# Patient Record
Sex: Female | Born: 1965 | Race: Black or African American | Hispanic: No | Marital: Single | Smoking: Never smoker
Health system: Southern US, Community
[De-identification: ages and names within clinical notes are randomized; demographics above are authoritative.]

## PROBLEM LIST (undated history)

## (undated) DIAGNOSIS — T7840XA Allergy, unspecified, initial encounter: Secondary | ICD-10-CM

## (undated) DIAGNOSIS — Z973 Presence of spectacles and contact lenses: Secondary | ICD-10-CM

## (undated) DIAGNOSIS — Z9289 Personal history of other medical treatment: Secondary | ICD-10-CM

## (undated) DIAGNOSIS — G4700 Insomnia, unspecified: Secondary | ICD-10-CM

## (undated) HISTORY — DX: Personal history of other medical treatment: Z92.89

## (undated) HISTORY — DX: Insomnia, unspecified: G47.00

## (undated) HISTORY — DX: Presence of spectacles and contact lenses: Z97.3

## (undated) HISTORY — DX: Allergy, unspecified, initial encounter: T78.40XA

---
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
## 2000-01-30 ENCOUNTER — Other Ambulatory Visit: Admission: RE | Admit: 2000-01-30 | Discharge: 2000-01-30 | Payer: Self-pay | Admitting: Family Medicine

## 2006-02-19 ENCOUNTER — Ambulatory Visit (HOSPITAL_COMMUNITY): Admission: RE | Admit: 2006-02-19 | Discharge: 2006-02-19 | Payer: Self-pay | Admitting: Obstetrics and Gynecology

## 2006-08-31 HISTORY — PX: TUBAL LIGATION: SHX77

## 2007-12-07 ENCOUNTER — Ambulatory Visit: Payer: Self-pay | Admitting: Family Medicine

## 2008-01-02 ENCOUNTER — Ambulatory Visit: Payer: Self-pay | Admitting: Family Medicine

## 2008-01-03 LAB — HM MAMMOGRAPHY

## 2008-11-21 ENCOUNTER — Ambulatory Visit: Payer: Self-pay | Admitting: Family Medicine

## 2008-12-05 ENCOUNTER — Ambulatory Visit: Payer: Self-pay | Admitting: Family Medicine

## 2010-02-06 ENCOUNTER — Ambulatory Visit: Payer: Self-pay | Admitting: Family Medicine

## 2010-02-13 ENCOUNTER — Ambulatory Visit: Payer: Self-pay | Admitting: Family Medicine

## 2010-07-14 ENCOUNTER — Ambulatory Visit: Payer: Self-pay | Admitting: Family Medicine

## 2010-08-15 ENCOUNTER — Ambulatory Visit: Payer: Self-pay | Admitting: Family Medicine

## 2011-02-20 ENCOUNTER — Encounter: Payer: Self-pay | Admitting: Family Medicine

## 2011-09-01 DIAGNOSIS — Z9289 Personal history of other medical treatment: Secondary | ICD-10-CM

## 2011-09-01 HISTORY — DX: Personal history of other medical treatment: Z92.89

## 2011-09-11 ENCOUNTER — Encounter: Payer: Self-pay | Admitting: Family Medicine

## 2011-09-18 ENCOUNTER — Ambulatory Visit (INDEPENDENT_AMBULATORY_CARE_PROVIDER_SITE_OTHER): Payer: BC Managed Care – PPO | Admitting: Family Medicine

## 2011-09-18 ENCOUNTER — Encounter: Payer: Self-pay | Admitting: Family Medicine

## 2011-09-18 VITALS — BP 118/70 | HR 87 | Ht 65.5 in | Wt 196.0 lb

## 2011-09-18 DIAGNOSIS — Z0000 Encounter for general adult medical examination without abnormal findings: Secondary | ICD-10-CM

## 2011-09-18 DIAGNOSIS — E785 Hyperlipidemia, unspecified: Secondary | ICD-10-CM

## 2011-09-18 NOTE — Progress Notes (Signed)
  Subjective:    Patient ID: Stacie Oconnell, female    DOB: 1966/07/02, 46 y.o.   MRN: [id number]  HPI She is here for a complete examination. Review of her record indicates she has had mammogram as well as Pap smear done in appropriate time intervals. She is not on any medications. She does not smoke. Her work is going well. She started an exercise program that includes swimming 3 or 4 times per week. He continues to work 2 jobs. Her personal life seems to be doing well..   Review of Systems  Constitutional: Negative.   HENT: Negative.   Eyes: Negative.   Respiratory: Negative.   Cardiovascular: Negative.   Gastrointestinal: Negative.   Genitourinary: Negative.   Musculoskeletal: Negative.   Skin: Negative.   Hematological: Negative.   Psychiatric/Behavioral: Negative.        Objective:   Physical Exam BP 118/70  Pulse 87  Ht 5' 5.5" (1.664 m)  Wt 196 lb (88.905 kg)  BMI 32.12 kg/m2  LMP 08/31/2011  General Appearance:    Alert, cooperative, no distress, appears stated age  Head:    Normocephalic, without obvious abnormality, atraumatic  Eyes:    PERRL, conjunctiva/corneas clear, EOM's intact, fundi    benign  Ears:    Normal TM's and external ear canals  Nose:   Nares normal, mucosa normal, no drainage or sinus   tenderness  Throat:   Lips, mucosa, and tongue normal; teeth and gums normal  Neck:   Supple, no lymphadenopathy;  thyroid:  no   enlargement/tenderness/nodules; no carotid   bruit or JVD  Back:    Spine nontender, no curvature, ROM normal, no CVA     tenderness  Lungs:     Clear to auscultation bilaterally without wheezes, rales or     ronchi; respirations unlabored  Chest Wall:    No tenderness or deformity   Heart:    Regular rate and rhythm, S1 and S2 normal, no murmur, rub   or gallop  Breast Exam:    Deferred to GYN  Abdomen:     Soft, non-tender, nondistended, normoactive bowel sounds,    no masses, no hepatosplenomegaly  Genitalia:    Deferred to GYN      Extremities:   No clubbing, cyanosis or edema  Pulses:   2+ and symmetric all extremities  Skin:   Skin color, texture, turgor normal, no rashes or lesions  Lymph nodes:   Cervical, supraclavicular, and axillary nodes normal  Neurologic:   CNII-XII intact, normal strength, sensation and gait; reflexes 2+ and symmetric throughout          Psych:   Normal mood, affect, hygiene and grooming.           Assessment & Plan:   1. Hyperlipidemia LDL goal < 100  Lipid panel  2. Routine general medical examination at a health care facility    Encouraged her to continue to diet and exercise regimen discussed other ways to increase her physical activity. Also discussed cutting back on carbohydrates and eating more frequent but smaller meals.

## 2011-09-18 NOTE — Patient Instructions (Signed)
Increase your physical activity especially during your breaks at work. Make dietary changes especially in regards to carbohydrates.

## 2011-09-19 LAB — LIPID PANEL
Cholesterol: 231 mg/dL — ABNORMAL HIGH (ref 0–200)
HDL: 59 mg/dL (ref >39)
Triglycerides: 106 mg/dL (ref <150)
VLDL: 21 mg/dL (ref 0–40)

## 2011-09-28 ENCOUNTER — Telehealth: Payer: Self-pay | Admitting: Internal Medicine

## 2011-09-28 NOTE — Telephone Encounter (Signed)
Did you see this? 

## 2011-09-28 NOTE — Telephone Encounter (Signed)
Pt can't not use eucerin cream or eveno. Pt can use dove soap and cause you a white lotion she can use at the dollar store.

## 2011-09-28 NOTE — Telephone Encounter (Signed)
Will set pt up for dermatolgist

## 2011-09-28 NOTE — Telephone Encounter (Signed)
Martie Lee will schedule with dermatology

## 2011-09-28 NOTE — Telephone Encounter (Signed)
She shouldn't be itching from swimming. It could be too much chlorine in water or maybe she's trying her skin out with too much soap and water. Eucerin cream or Eveno

## 2011-09-29 NOTE — Telephone Encounter (Signed)
Got her scheduled with Dr. Harriette Ohara February 1st,2013 @3 :30pm at [facility]

## 2012-04-12 ENCOUNTER — Telehealth: Payer: Self-pay | Admitting: Internal Medicine

## 2012-04-12 NOTE — Telephone Encounter (Signed)
Pt will come in the morning she has already tried all of what JCL suggested but she has plato and has 15 lbs more to lose

## 2012-04-12 NOTE — Telephone Encounter (Signed)
She can come in and talk to me about this. She can also be referred to the dietitian. Also let her know that I think the Weight Watchers program is a good one

## 2012-04-13 ENCOUNTER — Telehealth: Payer: Self-pay | Admitting: Internal Medicine

## 2012-04-13 ENCOUNTER — Ambulatory Visit (INDEPENDENT_AMBULATORY_CARE_PROVIDER_SITE_OTHER): Payer: BC Managed Care – PPO | Admitting: Family Medicine

## 2012-04-13 ENCOUNTER — Encounter: Payer: Self-pay | Admitting: Family Medicine

## 2012-04-13 VITALS — BP 120/80 | HR 90 | Ht 66.0 in | Wt 199.0 lb

## 2012-04-13 DIAGNOSIS — E669 Obesity, unspecified: Secondary | ICD-10-CM

## 2012-04-13 DIAGNOSIS — E785 Hyperlipidemia, unspecified: Secondary | ICD-10-CM | POA: Insufficient documentation

## 2012-04-13 LAB — LIPID PANEL
HDL: 65 mg/dL (ref >39)
LDL Cholesterol: 146 mg/dL — ABNORMAL HIGH (ref 0–99)
Total CHOL/HDL Ratio: 3.5 Ratio

## 2012-04-13 MED ORDER — SIBUTRAMINE HCL MONOHYDRATE 5 MG PO CAPS: 5.0000 mg | ORAL_CAPSULE | Freq: Every day | ORAL | Status: DC

## 2012-04-13 MED ORDER — PHENTERMINE HCL 15 MG PO CAPS: 15.0000 mg | ORAL_CAPSULE | ORAL | Status: DC

## 2012-04-13 NOTE — Progress Notes (Signed)
  Subjective:    Patient ID: Stacie Oconnell, female    DOB: [birth year], 46 y.o.   MRN: [id number]  HPI She is here for consultation. She has made some dietary changes to help with her lipids. She did have some lipids drawn in January which were elevated. She is also concerned about weight loss. Her lifestyle is quite busy with work and home life. She recently got her Masters degree. She is interested in trying medication.   Review of Systems     Objective:   Physical Exam Alert and in no distress otherwise not examined       Assessment & Plan:   1. Obesity (BMI 30.0-34.9)  Amb ref to Medical Nutrition Therapy-MNT, DISCONTINUED: sibutramine (MERIDIA) 5 MG capsule  2. Hyperlipidemia LDL goal < 100  Lipid panel, phentermine 15 MG capsule   prescription written for she and her main. Discussed the fact that I would only do this for one month. Strongly encouraged her to continue physical activities and not let anything get in the way. Explained to her that it is more of a lifestyle problem into a weight problem. She does tend understand this.

## 2012-04-13 NOTE — Patient Instructions (Signed)
Use the Meridia for one month to help you get started. Remember, I will not continue on this medication. Return here as needed.

## 2012-04-13 NOTE — Telephone Encounter (Signed)
error 

## 2012-06-17 ENCOUNTER — Ambulatory Visit: Payer: BC Managed Care – PPO | Admitting: *Deleted

## 2012-06-29 ENCOUNTER — Telehealth: Payer: Self-pay | Admitting: Family Medicine

## 2012-06-29 MED ORDER — EPINEPHRINE 0.3 MG/0.3ML IJ DEVI: 0.3000 mg | Freq: Once | INTRAMUSCULAR | Status: DC

## 2012-06-29 NOTE — Telephone Encounter (Signed)
Epipen called in 

## 2012-07-19 ENCOUNTER — Telehealth: Payer: Self-pay | Admitting: Family Medicine

## 2012-07-19 MED ORDER — EPINEPHRINE 0.3 MG/0.3ML IJ DEVI: 0.3000 mg | Freq: Once | INTRAMUSCULAR | Status: DC

## 2012-07-19 NOTE — Telephone Encounter (Signed)
LM

## 2012-10-26 ENCOUNTER — Ambulatory Visit (INDEPENDENT_AMBULATORY_CARE_PROVIDER_SITE_OTHER): Payer: BC Managed Care – PPO | Admitting: Family Medicine

## 2012-10-26 ENCOUNTER — Encounter: Payer: Self-pay | Admitting: Family Medicine

## 2012-10-26 VITALS — BP 120/88 | HR 78 | Temp 98.6°F | Wt 198.0 lb

## 2012-10-26 DIAGNOSIS — R05 Cough: Secondary | ICD-10-CM

## 2012-10-26 MED ORDER — ZOLPIDEM TARTRATE 10 MG PO TABS: 10.0000 mg | ORAL_TABLET | Freq: Every evening | ORAL | Status: DC | PRN

## 2012-10-26 MED ORDER — AMOXICILLIN 875 MG PO TABS: 875.0000 mg | ORAL_TABLET | Freq: Two times a day (BID) | ORAL | Status: DC

## 2012-10-26 NOTE — Patient Instructions (Signed)
Take 2 Prilosec daily for the next several days and if that does not work forcoughing get the prescription filled

## 2012-10-26 NOTE — Progress Notes (Signed)
  Subjective:    Patient ID: Bascom Levels, female    DOB: May 03, 1966, 47 y.o.   MRN: [id number]  HPI In early January she developed URI symptoms of nasal congestion, runny nose, dry cough, fever and malaise. She states she got better for least a week and then developed sneezing as well as dry cough.  She is now back to normal except for the dry cough especially at night.she does not smoke and has no allergies.she has not tried any H2 blocker or other medication.  She also is getting counseling helping deal with work related stress. She is working with Clarisse Gouge concerning this. It is interfering with her sleep and she would like medication for this.   Review of Systems     Objective:   Physical Exam alert and in no distress. Tympanic membranes and canals are normal. Throat is clear. Tonsils are normal. Neck is supple without adenopathy or thyromegaly. Cardiac exam shows a regular sinus rhythm without murmurs or gallops. Lungs are clear to auscultation.        Assessment & Plan:  Sleep disturbance - Plan: zolpidem (AMBIEN) 10 MG tablet  Stress at work  Cough - Plan: amoxicillin (AMOXIL) 875 MG tablet I will have her try a PPI for several days first and if no improvement, then she will get the Amoxil filled. Also discussed stress and stress management with her. I will have her use Ambien for a short period time and then keep the rest around 4 as needed purposes. She is getting a lot of help dealing with stress from work and seems to have fairly good handle on this.

## 2012-11-09 ENCOUNTER — Encounter: Payer: Self-pay | Admitting: Internal Medicine

## 2012-11-15 ENCOUNTER — Encounter: Payer: BC Managed Care – PPO | Admitting: Medical

## 2012-11-25 ENCOUNTER — Telehealth: Payer: Self-pay | Admitting: Family Medicine

## 2012-11-25 ENCOUNTER — Encounter: Payer: Self-pay | Admitting: Medical

## 2012-11-25 ENCOUNTER — Ambulatory Visit (INDEPENDENT_AMBULATORY_CARE_PROVIDER_SITE_OTHER): Payer: Federal, State, Local not specified - PPO | Admitting: Medical

## 2012-11-25 VITALS — BP 120/80 | HR 68 | Temp 98.3°F | Resp 16 | Ht 66.0 in | Wt 200.0 lb

## 2012-11-25 DIAGNOSIS — G4700 Insomnia, unspecified: Secondary | ICD-10-CM

## 2012-11-25 DIAGNOSIS — Z113 Encounter for screening for infections with a predominantly sexual mode of transmission: Secondary | ICD-10-CM

## 2012-11-25 DIAGNOSIS — Z23 Encounter for immunization: Secondary | ICD-10-CM

## 2012-11-25 DIAGNOSIS — F430 Acute stress reaction: Secondary | ICD-10-CM

## 2012-11-25 DIAGNOSIS — R079 Chest pain, unspecified: Secondary | ICD-10-CM

## 2012-11-25 DIAGNOSIS — Z0000 Encounter for general adult medical examination without abnormal findings: Secondary | ICD-10-CM

## 2012-11-25 LAB — POCT URINALYSIS DIPSTICK
Blood, UA: NEGATIVE
Glucose, UA: NEGATIVE
Nitrite, UA: NEGATIVE
Urobilinogen, UA: NEGATIVE
pH, UA: 5

## 2012-11-25 MED ORDER — TRAZODONE HCL 50 MG PO TABS: 25.0000 mg | ORAL_TABLET | Freq: Every evening | ORAL | Status: DC | PRN

## 2012-11-25 NOTE — Telephone Encounter (Signed)
Patient is aware of her mammogram on 12/06/12 @ 1000 am at [facility]. CLS
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
## 2012-11-26 ENCOUNTER — Encounter: Payer: Self-pay | Admitting: Medical

## 2012-11-26 LAB — RPR

## 2013-09-08 ENCOUNTER — Other Ambulatory Visit (INDEPENDENT_AMBULATORY_CARE_PROVIDER_SITE_OTHER): Payer: Federal, State, Local not specified - PPO

## 2013-09-08 DIAGNOSIS — Z23 Encounter for immunization: Secondary | ICD-10-CM

## 2013-10-27 ENCOUNTER — Telehealth: Payer: Self-pay | Admitting: Family Medicine

## 2013-10-27 MED ORDER — EPINEPHRINE 0.3 MG/0.3ML IJ SOAJ: 0.3000 mg | Freq: Once | INTRAMUSCULAR | Status: DC

## 2013-10-27 NOTE — Telephone Encounter (Signed)
Epi pen sent in. ?

## 2013-10-27 NOTE — Telephone Encounter (Signed)
Go ahead and reorder

## 2014-02-20 ENCOUNTER — Encounter: Payer: Self-pay | Admitting: Family Medicine

## 2014-02-20 ENCOUNTER — Ambulatory Visit (INDEPENDENT_AMBULATORY_CARE_PROVIDER_SITE_OTHER): Payer: Federal, State, Local not specified - PPO | Admitting: Family Medicine

## 2014-02-20 VITALS — BP 110/80 | HR 80 | Ht 65.0 in | Wt 192.0 lb

## 2014-02-20 DIAGNOSIS — Z209 Contact with and (suspected) exposure to unspecified communicable disease: Secondary | ICD-10-CM

## 2014-02-20 DIAGNOSIS — E669 Obesity, unspecified: Secondary | ICD-10-CM

## 2014-02-20 DIAGNOSIS — E785 Hyperlipidemia, unspecified: Secondary | ICD-10-CM

## 2014-02-20 DIAGNOSIS — E66811 Obesity, class 1: Secondary | ICD-10-CM

## 2014-02-20 DIAGNOSIS — Z0000 Encounter for general adult medical examination without abnormal findings: Secondary | ICD-10-CM

## 2014-02-20 LAB — POCT URINALYSIS DIPSTICK
Bilirubin, UA: NEGATIVE
GLUCOSE UA: NEGATIVE
Ketones, UA: NEGATIVE
Leukocytes, UA: NEGATIVE
NITRITE UA: NEGATIVE
PROTEIN UA: NEGATIVE
SPEC GRAV UA: 1.02
UROBILINOGEN UA: NEGATIVE
pH, UA: 5

## 2014-02-20 LAB — CBC WITH DIFFERENTIAL/PLATELET
BASOS PCT: 0 % (ref 0–1)
Basophils Absolute: 0 10*3/uL (ref 0.0–0.1)
EOS PCT: 1 % (ref 0–5)
Eosinophils Absolute: 0.1 10*3/uL (ref 0.0–0.7)
HEMATOCRIT: 40.6 % (ref 36.0–46.0)
HEMOGLOBIN: 14.2 g/dL (ref 12.0–15.0)
Lymphocytes Relative: 34 % (ref 12–46)
Lymphs Abs: 2.5 10*3/uL (ref 0.7–4.0)
MCH: 31.7 pg (ref 26.0–34.0)
MCHC: 35 g/dL (ref 30.0–36.0)
MCV: 90.6 fL (ref 78.0–100.0)
MONO ABS: 0.5 10*3/uL (ref 0.1–1.0)
MONOS PCT: 7 % (ref 3–12)
NEUTROS PCT: 58 % (ref 43–77)
Neutro Abs: 4.2 10*3/uL (ref 1.7–7.7)
Platelets: 284 10*3/uL (ref 150–400)
RBC: 4.48 MIL/uL (ref 3.87–5.11)
RDW: 13.3 % (ref 11.5–15.5)
WBC: 7.3 10*3/uL (ref 4.0–10.5)

## 2014-02-20 NOTE — Progress Notes (Signed)
   Subjective:    Patient ID: Stacie Oconnell, female    DOB: Jan 16, 1966, 48 y.o.   MRN: [id number]  HPI She is here for a general med check. She has no particular concerns. She is in the process of trying to find a new job. Apparently there are some work-related stress is going on. She has started an exercise program. Review his record indicates elevated lipid panel. Social and family history were reviewed. She has no other concerns or complaints. Her last gynecologic evaluation was 2 years ago. He would like to be STD tested just to be safe.   Review of Systems  All other systems reviewed and are negative.      Objective:   Physical Exam BP 110/80  Pulse 80  Ht 5\' 5"  (1.651 m)  Wt 192 lb (87.091 kg)  BMI 31.95 kg/m2  General Appearance:    Alert, cooperative, no distress, appears stated age  Head:    Normocephalic, without obvious abnormality, atraumatic  Eyes:    PERRL, conjunctiva/corneas clear, EOM's intact, fundi    benign  Ears:    Normal TM's and external ear canals  Nose:   Nares normalI  mucosa normal, no drainage or sinus   tenderness  Throat:   Lips, mucosa, and tongue normal; teeth and gums normal  Neck:   Supple, no lymphadenopathy;  thyroid:  no   enlargement/tenderness/nodules; no carotid   bruit or JVD  Back:    Spine nontender, no curvature, ROM normal, no CVA     tenderness  Lungs:     Clear to auscultation bilaterally without wheezes, rales or     ronchi; respirations unlabored  Chest Wall:    No tenderness or deformity   Heart:    Regular rate and rhythm, S1 and S2 normal, no murmur, rub   or gallop  Breast Exam:    Deferred to GYN  Abdomen:     Soft, non-tender, nondistended, normoactive bowel sounds,    no masses, no hepatosplenomegaly  Genitalia:    Deferred to GYN     Extremities:   No clubbing, cyanosis or edema  Pulses:   2+ and symmetric all extremities  Skin:   Skin color, texture, turgor normal, no rashes or lesions  Lymph nodes:   Cervical,  supraclavicular, and axillary nodes normal  Neurologic:   CNII-XII intact, normal strength, sensation and gait; reflexes 2+ and symmetric throughout          Psych:   Normal mood, affect, hygiene and grooming.          Assessment & Plan:  Routine general medical examination at a health care facility - Plan: Urinalysis Dipstick, CBC with Differential, Comprehensive metabolic panel, Lipid panel  Obesity (BMI 30.0-34.9)  Hyperlipidemia with target LDL less than 100 - Plan: Lipid panel  Contact with or exposure to unspecified communicable disease - Plan: HIV antibody, RPR discussed diet and exercise with her in regard to total time of exercising and cutting back on carbohydrates.,

## 2014-02-20 NOTE — Patient Instructions (Addendum)
Cut back on white food. 150 minutes a week of something physical 

## 2014-02-21 LAB — COMPREHENSIVE METABOLIC PANEL
ALK PHOS: 51 U/L (ref 39–117)
ALT: 13 U/L (ref 0–35)
AST: 13 U/L (ref 0–37)
Albumin: 4.8 g/dL (ref 3.5–5.2)
BILIRUBIN TOTAL: 1 mg/dL (ref 0.2–1.2)
BUN: 9 mg/dL (ref 6–23)
CO2: 26 mEq/L (ref 19–32)
CREATININE: 0.76 mg/dL (ref 0.50–1.10)
Calcium: 9.7 mg/dL (ref 8.4–10.5)
Chloride: 100 mEq/L (ref 96–112)
GLUCOSE: 91 mg/dL (ref 70–99)
Potassium: 4.2 mEq/L (ref 3.5–5.3)
Sodium: 136 mEq/L (ref 135–145)
Total Protein: 7.8 g/dL (ref 6.0–8.3)

## 2014-02-21 LAB — HIV ANTIBODY (ROUTINE TESTING W REFLEX): HIV 1&2 Ab, 4th Generation: NONREACTIVE

## 2014-02-21 LAB — LIPID PANEL
CHOLESTEROL: 220 mg/dL — AB (ref 0–200)
HDL: 66 mg/dL (ref >39)
LDL Cholesterol: 136 mg/dL — ABNORMAL HIGH (ref 0–99)
TRIGLYCERIDES: 91 mg/dL (ref <150)
Total CHOL/HDL Ratio: 3.3 Ratio
VLDL: 18 mg/dL (ref 0–40)

## 2014-02-21 LAB — RPR

## 2014-02-27 ENCOUNTER — Encounter: Payer: Self-pay | Admitting: Family Medicine

## 2014-02-27 ENCOUNTER — Telehealth: Payer: Self-pay

## 2014-02-27 NOTE — Telephone Encounter (Signed)
Letter typed for pt, left message ready for pick up

## 2014-02-27 NOTE — Telephone Encounter (Signed)
Take care of this 

## 2014-02-27 NOTE — Telephone Encounter (Signed)
Pt states she is getting ready to go out of the country and she needs a note to take on the plane with her saying that she has skin rash problems and that she can only use certain lotions as well as she has to carry her epi pen with her at all times.

## 2014-03-07 ENCOUNTER — Telehealth: Payer: Self-pay | Admitting: Family Medicine

## 2014-03-07 NOTE — Telephone Encounter (Signed)
Needs OV.  

## 2014-03-07 NOTE — Telephone Encounter (Signed)
She needs to schedule an appt

## 2014-03-08 NOTE — Telephone Encounter (Signed)
Pt advised that she needs appt. She is going out of town today so she will call when she gets back in town to schedule an appt

## 2014-03-28 ENCOUNTER — Ambulatory Visit (INDEPENDENT_AMBULATORY_CARE_PROVIDER_SITE_OTHER): Payer: Federal, State, Local not specified - PPO | Admitting: Family Medicine

## 2014-03-28 ENCOUNTER — Encounter: Payer: Self-pay | Admitting: Family Medicine

## 2014-03-28 VITALS — BP 150/100 | HR 80 | Wt 194.0 lb

## 2014-03-28 DIAGNOSIS — G4700 Insomnia, unspecified: Secondary | ICD-10-CM

## 2014-03-28 DIAGNOSIS — Z569 Unspecified problems related to employment: Secondary | ICD-10-CM

## 2014-03-28 DIAGNOSIS — Z566 Other physical and mental strain related to work: Secondary | ICD-10-CM

## 2014-03-28 MED ORDER — TRAZODONE HCL 50 MG PO TABS: 25.0000 mg | ORAL_TABLET | Freq: Every evening | ORAL | Status: DC | PRN

## 2014-03-28 NOTE — Patient Instructions (Addendum)
Insomnia Insomnia is frequent trouble falling and/or staying asleep. Insomnia can be a long term problem or a short term problem. Both are common. Insomnia can be a short term problem when the wakefulness is related to a certain stress or worry. Long term insomnia is often related to ongoing stress during waking hours and/or poor sleeping habits. Overtime, sleep deprivation itself can make the problem worse. Every little thing feels more severe because you are overtired and your ability to cope is decreased. CAUSES   Stress, anxiety, and depression.  Poor sleeping habits.  Distractions such as TV in the bedroom.  Naps close to bedtime.  Engaging in emotionally charged conversations before bed.  Technical reading before sleep.  Alcohol and other sedatives. They may make the problem worse. They can hurt normal sleep patterns and normal dream activity.  Stimulants such as caffeine for several hours prior to bedtime.  Pain syndromes and shortness of breath can cause insomnia.  Exercise late at night.  Changing time zones may cause sleeping problems (jet lag). It is sometimes helpful to have someone observe your sleeping patterns. They should look for periods of not breathing during the night (sleep apnea). They should also look to see how long those periods last. If you live alone or observers are uncertain, you can also be observed at a sleep clinic where your sleep patterns will be professionally monitored. Sleep apnea requires a checkup and treatment. Give your caregivers your medical history. Give your caregivers observations your family has made about your sleep.  SYMPTOMS   Not feeling rested in the morning.  Anxiety and restlessness at bedtime.  Difficulty falling and staying asleep. TREATMENT   Your caregiver may prescribe treatment for an underlying medical disorders. Your caregiver can give advice or help if you are using alcohol or other drugs for self-medication. Treatment  of underlying problems will usually eliminate insomnia problems.  Medications can be prescribed for short time use. They are generally not recommended for lengthy use.  Over-the-counter sleep medicines are not recommended for lengthy use. They can be habit forming.  You can promote easier sleeping by making lifestyle changes such as:  Using relaxation techniques that help with breathing and reduce muscle tension.  Exercising earlier in the day.  Changing your diet and the time of your last meal. No night time snacks.  Establish a regular time to go to bed.  Counseling can help with stressful problems and worry.  Soothing music and white noise may be helpful if there are background noises you cannot remove.  Stop tedious detailed work at least one hour before bedtime. HOME CARE INSTRUCTIONS   Keep a diary. Inform your caregiver about your progress. This includes any medication side effects. See your caregiver regularly. Take note of:  Times when you are asleep.  Times when you are awake during the night.  The quality of your sleep.  How you feel the next day. This information will help your caregiver care for you.  Get out of bed if you are still awake after 15 minutes. Read or do some quiet activity. Keep the lights down. Wait until you feel sleepy and go back to bed.  Keep regular sleeping and waking hours. Avoid naps.  Exercise regularly.  Avoid distractions at bedtime. Distractions include watching television or engaging in any intense or detailed activity like attempting to balance the household checkbook.  Develop a bedtime ritual. Keep a familiar routine of bathing, brushing your teeth, climbing into bed at the same   time each night, listening to soothing music. Routines increase the success of falling to sleep faster.  Use relaxation techniques. This can be using breathing and muscle tension release routines. It can also include visualizing peaceful scenes. You can  also help control troubling or intruding thoughts by keeping your mind occupied with boring or repetitive thoughts like the old concept of counting sheep. You can make it more creative like imagining planting one beautiful flower after another in your backyard garden.  During your day, work to eliminate stress. When this is not possible use some of the previous suggestions to help reduce the anxiety that accompanies stressful situations. MAKE SURE YOU:   Understand these instructions.  Will watch your condition.  Will get help right away if you are not doing well or get worse. Document Released: 08/14/2000 Document Revised: 11/09/2011 Document Reviewed: 09/14/2007 [facility] Patient Information 2015 [place], [place]. This information is not intended to replace advice given to you by your health care provider. Make sure you discuss any questions you have with your health care provider. Use of sleep medicine regularly for a week and then after that use it as needed.

## 2014-03-28 NOTE — Progress Notes (Signed)
   Subjective:    Patient ID: Stacie Oconnell, female    DOB: 02-Oct-1965, 48 y.o.   MRN: [id number]  HPI She is here for consultation. She continues in counseling with Catha Gosselin for dealing with work related stress and anxiety. She has been involved in counseling for this for the last 2 years. Apparently she is going to be taking a leave of absence to help with the stress and look for another job. Apparently the therapist will sign for this. There is also concerned about insomnia. She does state that on weekends she does sleep better than during the week.   Review of Systems     Objective:   Physical Exam Alert and in no distress otherwise not examined       Assessment & Plan:  Insomnia - Plan: traZODone (DESYREL) 50 MG tablet  Work-related stress  I will place her on trazodone. Recommend using this for a week to try and break the cycle and then after that on an as-needed basis explained the fact that her work related stresses now affecting her ability to sleep. Discussed options concerning how she is handling the anxiety. She has been involved in counseling for 2 years and states she has made progress but obviously not enough to get her toe position of not needing a leave of absence and interfered with sleep recheck here as needed.

## 2014-04-17 ENCOUNTER — Ambulatory Visit (INDEPENDENT_AMBULATORY_CARE_PROVIDER_SITE_OTHER): Payer: Federal, State, Local not specified - PPO | Admitting: Family Medicine

## 2014-04-17 ENCOUNTER — Telehealth: Payer: Self-pay | Admitting: Internal Medicine

## 2014-04-17 VITALS — BP 140/90 | Wt 191.0 lb

## 2014-04-17 DIAGNOSIS — R1032 Left lower quadrant pain: Secondary | ICD-10-CM

## 2014-04-17 LAB — POCT URINALYSIS DIPSTICK
BILIRUBIN UA: NEGATIVE
Blood, UA: NEGATIVE
Glucose, UA: NEGATIVE
KETONES UA: NEGATIVE
Leukocytes, UA: NEGATIVE
Nitrite, UA: NEGATIVE
PROTEIN UA: NEGATIVE
SPEC GRAV UA: 1.02
Urobilinogen, UA: NEGATIVE
pH, UA: 5

## 2014-04-17 LAB — HEMOCCULT GUIAC POC 1CARD (OFFICE)

## 2014-04-17 NOTE — Progress Notes (Signed)
   Subjective:    Patient ID: Stacie Oconnell, female    DOB: August 07, 1966, 48 y.o.   MRN: [id number]  HPI On August 9 she noted the onset of left sided pain in the area of the ASIS . She noted a slightly numb feeling in that area. She also is having some left lower quadrant pain but states it mainly occurs at night. She does note that leaning forward diminishes the discomfort. She did note one episode of black stool but no other change in her bowel habits. No urinary symptoms. Presently she states she is feeling better.   Review of Systems     Objective:   Physical Exam Alert and in no distress. Abdominal exam shows some tenderness to palpation in the left lower quadrant with questionable rebound. Pelvic exam shows tenderness but no mass. Rectal is guaiac-negative. Analysis negative.       Assessment & Plan:  Abdominal pain, left lower quadrant - Plan: POCT Urinalysis Dipstick, POCT occult blood stool  since she is doing better, I will hold off on any further evaluation. She continues to do well, no intervention needed.

## 2014-04-17 NOTE — Telephone Encounter (Signed)
Faxed over medical records to [employer] to [phone]

## 2014-07-13 ENCOUNTER — Encounter: Payer: Self-pay | Admitting: Medical

## 2014-07-13 ENCOUNTER — Ambulatory Visit (INDEPENDENT_AMBULATORY_CARE_PROVIDER_SITE_OTHER): Payer: Federal, State, Local not specified - PPO | Admitting: Medical

## 2014-07-13 VITALS — BP 118/80 | HR 87 | Temp 98.4°F | Resp 16 | Wt 200.0 lb

## 2014-07-13 DIAGNOSIS — M79646 Pain in unspecified finger(s): Secondary | ICD-10-CM

## 2014-07-13 DIAGNOSIS — M25572 Pain in left ankle and joints of left foot: Secondary | ICD-10-CM

## 2014-07-13 DIAGNOSIS — M25579 Pain in unspecified ankle and joints of unspecified foot: Secondary | ICD-10-CM

## 2014-07-13 DIAGNOSIS — M25571 Pain in right ankle and joints of right foot: Secondary | ICD-10-CM

## 2014-07-13 DIAGNOSIS — M25559 Pain in unspecified hip: Secondary | ICD-10-CM

## 2014-07-13 DIAGNOSIS — Z23 Encounter for immunization: Secondary | ICD-10-CM

## 2014-07-13 DIAGNOSIS — M069 Rheumatoid arthritis, unspecified: Secondary | ICD-10-CM

## 2014-07-13 MED ORDER — HYDROCODONE-ACETAMINOPHEN 5-325 MG PO TABS: 1.0000 | ORAL_TABLET | Freq: Four times a day (QID) | ORAL | Status: DC | PRN

## 2014-07-13 MED ORDER — IBUPROFEN 800 MG PO TABS: 800.0000 mg | ORAL_TABLET | Freq: Three times a day (TID) | ORAL | Status: DC | PRN

## 2014-07-13 NOTE — Progress Notes (Signed)
Subjective: Here for c/o hip pain.  Here for hip pains other joint pains.  She actually reports a history of rheumatoid arthritis diagnoses years ago in [place].  At one point she was on chronic prednisone but decided not to continue on medications, particularly prednisone.  She gets intermittent flareups.  She had a bad flareup of hip pains in August while she was out on a cruise, even had numbness in the left upper thigh.  Over a week or 2 things resolved.  However for the last 2 weeks his had another flareup.  In the last week she notes bilateral hip pains, hip joint swelling,hip pains bilaterally left worse than right, also gets bilateral ankle pain and swelling, bilateral knee clicking, some fingers will swell and get painful bilaterally.  Pain worse with climbing stairs lifting legs up.  Has some back pain as well.  No current numbness tingling or weakness, no incontinence.  She does report seeing a rheumatologist here in town a few years ago but can't recall the name.  wants a flu shot today  ROS as in subjective   Objective: Filed Vitals:   07/13/14 1133  BP: 118/80  Pulse: 87  Temp: 98.4 F (36.9 C)  Resp: 16    General appearance: alert, no distress, WD/WN Back:Tender bilateral lumbar paraspinal muscles, mild pain with range of motion which is decreased [id number] to palpation over both hip joints, pain with bilateral hip range of motion, worse with external range of motion, worse left, bilateral patella's and mediolateral knees tender to palpation but no swelling, no laxity.  Bilateral ankles with mild tenderness generalized, no swelling. No obvious finger deformity pain or swelling, likewise no wrist abnormality Pulses: 2+ symmetric, upper and lower extremities, normal cap refill Extremities no edema Neuro: Normal lower extremity sensation strength and DTRs    Assessment: Encounter Diagnoses  Name Primary?  . Hip pain, unspecified laterality Yes  . Pain in joint,  ankle and foot, unspecified laterality   . Rheumatoid arthritis   . Bilateral ankle pain   . Pain of finger, unspecified laterality   . Flu vaccine need      Plan: We'll send for x-rays bilateral hips, sedimentation rate lab, Will request prior records, RA evaluation.  Prescription today for hydrocodone when necessary, ibuprofen 3 times a day for the next several days for flare up, discussed difference between rheumatoid and osteoarthritis, possible complications, treatment recommendations and options. Referral back to rheumatology once we get records  Counseled on the influenza virus vaccine.  Vaccine information sheet given.  Influenza vaccine given after consent obtained.  F/u pending xray, lab  Patient was seen in conjunction with PA student Veatrice Bourbon, and I have also evaluated and examined patient, agree with student's notes, student supervised by me.

## 2014-07-14 LAB — SEDIMENTATION RATE: Sed Rate: 9 mm/hr (ref 0–22)

## 2014-07-17 ENCOUNTER — Telehealth: Payer: Self-pay | Admitting: Family Medicine

## 2014-07-17 MED ORDER — EPINEPHRINE 0.3 MG/0.3ML IJ SOAJ: 0.3000 mg | Freq: Once | INTRAMUSCULAR | Status: DC

## 2014-07-17 MED ORDER — EPINEPHRINE HCL 1 MG/ML IJ SOLN: 1.0000 mg | Freq: Once | INTRAMUSCULAR | Status: DC

## 2014-07-17 NOTE — Telephone Encounter (Signed)
Pharmacy called for clarification for epi-pen, this was corrected

## 2014-07-17 NOTE — Telephone Encounter (Signed)
Patient aware i called and spoke with patient she verbalized understanding

## 2014-07-17 NOTE — Telephone Encounter (Signed)
Let her know that I called medication in and have her also take Benadryl
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
## 2015-12-20 ENCOUNTER — Encounter: Payer: Self-pay | Admitting: Family Medicine

## 2015-12-20 ENCOUNTER — Telehealth: Payer: Self-pay | Admitting: Family Medicine

## 2015-12-20 ENCOUNTER — Ambulatory Visit (INDEPENDENT_AMBULATORY_CARE_PROVIDER_SITE_OTHER): Payer: Managed Care, Other (non HMO) | Admitting: Family Medicine

## 2015-12-20 VITALS — BP 118/80 | HR 90 | Temp 98.0°F | Wt 201.6 lb

## 2015-12-20 DIAGNOSIS — J309 Allergic rhinitis, unspecified: Secondary | ICD-10-CM

## 2015-12-20 MED ORDER — EPINEPHRINE 0.3 MG/0.3ML IJ SOAJ: 0.3000 mg | Freq: Once | INTRAMUSCULAR | Status: DC

## 2015-12-20 MED ORDER — MONTELUKAST SODIUM 10 MG PO TABS: 10.0000 mg | ORAL_TABLET | Freq: Every day | ORAL | Status: DC

## 2015-12-20 NOTE — Patient Instructions (Signed)
Try Zyrtec but do it at night. As the nasal steroid spray regularly Prilosec but take 2 of them an hour before bedtime

## 2015-12-20 NOTE — Progress Notes (Signed)
   Subjective:    Patient ID: Stacie Oconnell, female    DOB: 05-Jun-1966, 50 y.o.   MRN: [id number]  HPI In January she noted difficulty starting with rhinorrhea, nasal congestion, PND with a dry cough. The cough was especially bad at night. She was seen in an urgent care center in March and diagnosed with allergies. She was given a 6 day prednisone Dosepak which she says really had no effect. She notes that these symptoms started when she moved her from [place]. She has tried Human resources officer and Claritin without much success. She has no history of smoking. No history of asthma.   Review of Systems     Objective:   Physical Exam Alert and in no distress. Tympanic membranes and canals are normal. Pharyngeal area is normal. Neck is supple without adenopathy or thyromegaly. Cardiac exam shows a regular sinus rhythm without murmurs or gallops. Lungs are clear to auscultation.        Assessment & Plan:  Allergic rhinitis, unspecified allergic rhinitis type - Plan: montelukast (SINGULAIR) 10 MG tablet, EPINEPHrine 0.3 mg/0.3 mL IJ SOAJ injection she is to use Zyrtec at night as well as Flonase nasal spray regularly. Demonstrated correct use of the Flonase. Also recommend using Prilosec to see if it'll help with the nighttime cough as the coughing could possibly represent reflux. She will let me know how this works.

## 2015-12-20 NOTE — Telephone Encounter (Signed)
Pt forgot to ask for refill on EpiPen at her appt today. She states that she need 3 EpiPens. One to keep with her, one for work, and one for her [place] home

## 2016-01-03 ENCOUNTER — Ambulatory Visit (INDEPENDENT_AMBULATORY_CARE_PROVIDER_SITE_OTHER): Payer: Managed Care, Other (non HMO) | Admitting: Family Medicine

## 2016-01-03 ENCOUNTER — Encounter: Payer: Self-pay | Admitting: Family Medicine

## 2016-01-03 VITALS — BP 120/80 | HR 75 | Ht 65.0 in | Wt 201.0 lb

## 2016-01-03 DIAGNOSIS — J309 Allergic rhinitis, unspecified: Secondary | ICD-10-CM

## 2016-01-03 DIAGNOSIS — K219 Gastro-esophageal reflux disease without esophagitis: Secondary | ICD-10-CM | POA: Insufficient documentation

## 2016-01-03 NOTE — Patient Instructions (Signed)
Weight truly end of the month and see what symptoms are still there after you quit. If it's a causing use of Prilosec efficiency the others get back on the Flonase first been Zyrtec and Singulair

## 2016-01-03 NOTE — Progress Notes (Signed)
   Subjective:    Patient ID: Stacie Oconnell, female    DOB: 05/19/1966, 50 y.o.   MRN: [id number]  HPI She is here for a recheck. She states that all of her symptoms are greatly improved. She is having less coughing, mucus and itchy watery eyes. Presently she is taking Flonase and Singulair but has not used the Zyrtec. She also has been taking Prilosec.She has not had difficulty in the past with allergies.   Review of Systems     Objective:   Physical Exam Alert and in no distress otherwise not examined       Assessment & Plan:  Allergic rhinitis, unspecified allergic rhinitis type  Gastroesophageal reflux disease without esophagitis When she continue on her present regimen through the month of May.At that point she will stop the medication and treat any symptoms that recur.

## 2016-01-08 ENCOUNTER — Telehealth: Payer: Self-pay | Admitting: Internal Medicine

## 2016-01-08 DIAGNOSIS — J309 Allergic rhinitis, unspecified: Secondary | ICD-10-CM

## 2016-01-08 MED ORDER — EPINEPHRINE 0.3 MG/0.3ML IJ SOAJ: 0.3000 mg | Freq: Once | INTRAMUSCULAR | Status: AC

## 2016-01-08 NOTE — Telephone Encounter (Signed)
Pt called to let us know that her insurance will pay for brand name only and will NOT pay for generic. I refilled med asking pharmacy to give brand name only

## 2016-01-11 ENCOUNTER — Telehealth: Payer: Self-pay | Admitting: Family Medicine

## 2016-01-14 NOTE — Telephone Encounter (Signed)
Pt informed

## 2016-01-14 NOTE — Telephone Encounter (Signed)
Called [facility] [phone] due to not being able to do P.A. Online due to pt's insurance card pt's name is Stacie Oconnell  ID# [id number].  P.A. Denied pt needs trial of generic epipen

## 2016-01-16 ENCOUNTER — Telehealth: Payer: Self-pay | Admitting: Family Medicine

## 2016-01-16 NOTE — Telephone Encounter (Signed)
Pt call and stated that the medication she was given about a month ago is not working. She was given Singular. She would like something else. Pt uses [facility] and can be reached at [phone].

## 2016-01-16 NOTE — Telephone Encounter (Signed)
Make sure she is on Flonase , Zyrtec and Singulair. We don't really have a medicine to replace his Singulair but she could switch to Nasacort and try Allegra or Claritin. If there are any questions however set up an appointment

## 2016-01-16 NOTE — Telephone Encounter (Signed)
Pt informed and she stated she will try new meds

## 2016-01-17 ENCOUNTER — Encounter (HOSPITAL_COMMUNITY): Payer: Self-pay | Admitting: Emergency Medicine

## 2016-01-17 ENCOUNTER — Ambulatory Visit (INDEPENDENT_AMBULATORY_CARE_PROVIDER_SITE_OTHER): Payer: Managed Care, Other (non HMO) | Admitting: Family Medicine

## 2016-01-17 ENCOUNTER — Emergency Department (HOSPITAL_COMMUNITY): Payer: Managed Care, Other (non HMO)

## 2016-01-17 ENCOUNTER — Emergency Department (HOSPITAL_COMMUNITY)
Admission: EM | Admit: 2016-01-17 | Discharge: 2016-01-17 | Disposition: A | Discharge status: Home / Self Care | Payer: Managed Care, Other (non HMO) | Attending: Emergency Medicine | Admitting: Emergency Medicine

## 2016-01-17 VITALS — BP 130/70 | HR 130 | Temp 102.6°F | Resp 21 | Wt 191.0 lb

## 2016-01-17 DIAGNOSIS — Z8669 Personal history of other diseases of the nervous system and sense organs: Secondary | ICD-10-CM | POA: Insufficient documentation

## 2016-01-17 DIAGNOSIS — R509 Fever, unspecified: Secondary | ICD-10-CM | POA: Diagnosis not present

## 2016-01-17 DIAGNOSIS — R05 Cough: Secondary | ICD-10-CM

## 2016-01-17 DIAGNOSIS — R0682 Tachypnea, not elsewhere classified: Secondary | ICD-10-CM

## 2016-01-17 DIAGNOSIS — J159 Unspecified bacterial pneumonia: Secondary | ICD-10-CM | POA: Diagnosis not present

## 2016-01-17 DIAGNOSIS — E876 Hypokalemia: Secondary | ICD-10-CM | POA: Diagnosis not present

## 2016-01-17 DIAGNOSIS — N289 Disorder of kidney and ureter, unspecified: Secondary | ICD-10-CM | POA: Insufficient documentation

## 2016-01-17 DIAGNOSIS — Z79899 Other long term (current) drug therapy: Secondary | ICD-10-CM | POA: Diagnosis not present

## 2016-01-17 DIAGNOSIS — J189 Pneumonia, unspecified organism: Secondary | ICD-10-CM

## 2016-01-17 DIAGNOSIS — R059 Cough, unspecified: Secondary | ICD-10-CM

## 2016-01-17 LAB — I-STAT CG4 LACTIC ACID, ED
LACTIC ACID, VENOUS: 1.66 mmol/L (ref 0.5–2.0)
LACTIC ACID, VENOUS: 1.27 mmol/L (ref 0.5–2.0)

## 2016-01-17 LAB — CBC WITH DIFFERENTIAL/PLATELET
BASOS ABS: 0 10*3/uL (ref 0.0–0.1)
BASOS PCT: 0 %
Eosinophils Absolute: 0 10*3/uL (ref 0.0–0.7)
Eosinophils Relative: 0 %
HEMATOCRIT: 39.1 % (ref 36.0–46.0)
HEMOGLOBIN: 13.2 g/dL (ref 12.0–15.0)
Lymphocytes Relative: 7 %
Lymphs Abs: 1.2 10*3/uL (ref 0.7–4.0)
MCH: 30.2 pg (ref 26.0–34.0)
MCHC: 33.8 g/dL (ref 30.0–36.0)
MCV: 89.5 fL (ref 78.0–100.0)
MONOS PCT: 6 %
Monocytes Absolute: 1.1 10*3/uL — ABNORMAL HIGH (ref 0.1–1.0)
NEUTROS ABS: 15.4 10*3/uL — AB (ref 1.7–7.7)
NEUTROS PCT: 87 %
Platelets: 216 10*3/uL (ref 150–400)
RBC: 4.37 MIL/uL (ref 3.87–5.11)
RDW: 12.9 % (ref 11.5–15.5)
WBC: 17.8 10*3/uL — AB (ref 4.0–10.5)

## 2016-01-17 LAB — BASIC METABOLIC PANEL
ANION GAP: 17 — AB (ref 5–15)
BUN: 14 mg/dL (ref 6–20)
CHLORIDE: 98 mmol/L — AB (ref 101–111)
CO2: 21 mmol/L — AB (ref 22–32)
Calcium: 9.2 mg/dL (ref 8.9–10.3)
Creatinine, Ser: 1.83 mg/dL — ABNORMAL HIGH (ref 0.44–1.00)
GFR calc non Af Amer: 31 mL/min — ABNORMAL LOW (ref >60)
GFR, EST AFRICAN AMERICAN: 36 mL/min — AB (ref >60)
Glucose, Bld: 162 mg/dL — ABNORMAL HIGH (ref 65–99)
POTASSIUM: 2.8 mmol/L — AB (ref 3.5–5.1)
Sodium: 136 mmol/L (ref 135–145)

## 2016-01-17 MED ORDER — HYDROCOD POLST-CPM POLST ER 10-8 MG/5ML PO SUER
5.0000 mL | Freq: Once | ORAL | Status: AC
  Administered 2016-01-17: 5 mL via ORAL
  Filled 2016-01-17: qty 5

## 2016-01-17 MED ORDER — PREDNISONE 50 MG PO TABS: ORAL_TABLET | ORAL | Status: DC

## 2016-01-17 MED ORDER — POTASSIUM CHLORIDE CRYS ER 20 MEQ PO TBCR: 20.0000 meq | EXTENDED_RELEASE_TABLET | Freq: Two times a day (BID) | ORAL | Status: AC

## 2016-01-17 MED ORDER — ACETAMINOPHEN 325 MG PO TABS
ORAL_TABLET | ORAL | Status: AC
  Filled 2016-01-17: qty 2

## 2016-01-17 MED ORDER — DEXTROSE 5 % IV SOLN
1.0000 g | Freq: Once | INTRAVENOUS | Status: AC
  Administered 2016-01-17: 1 g via INTRAVENOUS
  Filled 2016-01-17: qty 10

## 2016-01-17 MED ORDER — ACETAMINOPHEN 325 MG PO TABS
650.0000 mg | ORAL_TABLET | Freq: Once | ORAL | Status: AC | PRN
  Administered 2016-01-17: 650 mg via ORAL

## 2016-01-17 MED ORDER — DEXTROSE 5 % IV SOLN
500.0000 mg | Freq: Once | INTRAVENOUS | Status: AC
  Administered 2016-01-17: 500 mg via INTRAVENOUS
  Filled 2016-01-17: qty 500

## 2016-01-17 MED ORDER — ALBUTEROL SULFATE HFA 108 (90 BASE) MCG/ACT IN AERS: 1.0000 | INHALATION_SPRAY | Freq: Four times a day (QID) | RESPIRATORY_TRACT | Status: AC | PRN

## 2016-01-17 MED ORDER — IPRATROPIUM-ALBUTEROL 0.5-2.5 (3) MG/3ML IN SOLN
3.0000 mL | Freq: Once | RESPIRATORY_TRACT | Status: AC
  Administered 2016-01-17: 3 mL via RESPIRATORY_TRACT
  Filled 2016-01-17: qty 3

## 2016-01-17 MED ORDER — HYDROCOD POLST-CPM POLST ER 10-8 MG/5ML PO SUER: 5.0000 mL | Freq: Two times a day (BID) | ORAL | Status: DC | PRN

## 2016-01-17 MED ORDER — SODIUM CHLORIDE 0.9 % IV BOLUS (SEPSIS)
1000.0000 mL | Freq: Once | INTRAVENOUS | Status: AC
  Administered 2016-01-17: 1000 mL via INTRAVENOUS

## 2016-01-17 MED ORDER — LEVOFLOXACIN 500 MG PO TABS: 500.0000 mg | ORAL_TABLET | Freq: Every day | ORAL | Status: DC

## 2016-01-17 MED ORDER — METHYLPREDNISOLONE SODIUM SUCC 125 MG IJ SOLR
125.0000 mg | Freq: Once | INTRAMUSCULAR | Status: AC
  Administered 2016-01-17: 125 mg via INTRAVENOUS
  Filled 2016-01-17: qty 2

## 2016-01-17 NOTE — ED Notes (Signed)
IV start attempted x 2 unsuccessfully. Order for IV team consult placed. Unable to start fluid boluses or IV antibiotics at this time.

## 2016-01-17 NOTE — Progress Notes (Signed)
   Subjective:    Patient ID: Stacie Oconnell, female    DOB: 1965/12/01, 50 y.o.   MRN: [id number]  HPI  Approximately 1 week ago she started to develop a dry cough and gagging sensation. She did well until  Sunday of this week when she developed fever , chills, productive cough, shortness of breath and dizziness. The symptoms have continued and have gotten worse.   Review of Systems     Objective:   Physical Exam Alert and toxic appearing with tachypnea. Tympanic membranes and canals are normal. Pharyngeal area is normal. Neck is supple without adenopathy or thyromegaly. Cardiac exam shows a regular sinus rhythm without murmurs or gallops. Lungs  Show decreased breath sounds in the left base. Pulse ox is between 92 and 96        Assessment & Plan:  Cough  Tachypnea  Fever and chills  clinically she is presenting with pneumonia. I am not comfortable treating this as an outpatient and will therefore refer to the emergency room.
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
## 2016-01-17 NOTE — Discharge Instructions (Signed)
Community-Acquired Pneumonia, Adult °Pneumonia is an infection of the lungs. There are different types of pneumonia. One type can develop while a person is in a hospital. A different type, called community-acquired pneumonia, develops in people who are not, or have not recently been, in the hospital or other health care facility.  °CAUSES °Pneumonia may be caused by bacteria, viruses, or funguses. Community-acquired pneumonia is often caused by Streptococcus pneumonia bacteria. These bacteria are often passed from one person to another by breathing in droplets from the cough or sneeze of an infected person. °RISK FACTORS °The condition is more likely to develop in: °· People who have chronic diseases, such as chronic obstructive pulmonary disease (COPD), asthma, congestive heart failure, cystic fibrosis, diabetes, or kidney disease. °· People who have early-stage or late-stage HIV. °· People who have sickle cell disease. °· People who have had their spleen removed (splenectomy). °· People who have poor dental hygiene. °· People who have medical conditions that increase the risk of breathing in (aspirating) secretions their own mouth and nose.   °· People who have a weakened immune system (immunocompromised). °· People who smoke. °· People who travel to areas where pneumonia-causing germs commonly exist. °· People who are around animal habitats or animals that have pneumonia-causing germs, including birds, bats, rabbits, cats, and farm animals. °SYMPTOMS °Symptoms of this condition include: °· A dry cough. °· A wet (productive) cough. °· Fever. °· Sweating. °· Chest pain, especially when breathing deeply or coughing. °· Rapid breathing or difficulty breathing. °· Shortness of breath. °· Shaking chills. °· Fatigue. °· Muscle aches. °DIAGNOSIS °Your health care provider will take a medical history and perform a physical exam. You may also have other tests, including: °· Imaging studies of your chest, including  X-rays. °· Tests to check your blood oxygen level and other blood gases. °· Other tests on blood, mucus (sputum), fluid around your lungs (pleural fluid), and urine. °If your pneumonia is severe, other tests may be done to identify the specific cause of your illness. °TREATMENT °The type of treatment that you receive depends on many factors, such as the cause of your pneumonia, the medicines you take, and other medical conditions that you have. For most adults, treatment and recovery from pneumonia may occur at home. In some cases, treatment must happen in a hospital. Treatment may include: °· Antibiotic medicines, if the pneumonia was caused by bacteria. °· Antiviral medicines, if the pneumonia was caused by a virus. °· Medicines that are given by mouth or through an IV tube. °· Oxygen. °· Respiratory therapy. °Although rare, treating severe pneumonia may include: °· Mechanical ventilation. This is done if you are not breathing well on your own and you cannot maintain a safe blood oxygen level. °· Thoracentesis. This procedure removes fluid around one lung or both lungs to help you breathe better. °HOME CARE INSTRUCTIONS °· Take over-the-counter and prescription medicines only as told by your health care provider. °¨ Only take cough medicine if you are losing sleep. Understand that cough medicine can prevent your body's natural ability to remove mucus from your lungs. °¨ If you were prescribed an antibiotic medicine, take it as told by your health care provider. Do not stop taking the antibiotic even if you start to feel better. °· Sleep in a semi-upright position at night. Try sleeping in a reclining chair, or place a few pillows under your head. °· Do not use tobacco products, including cigarettes, chewing tobacco, and e-cigarettes. If you need help quitting, ask your health care provider. °· Drink enough water to keep your urine   clear or pale yellow. This will help to thin out mucus secretions in your  lungs. PREVENTION There are ways that you can decrease your risk of developing community-acquired pneumonia. Consider getting a pneumococcal vaccine if:  You are older than 50 years of age.  You are older than 50 years of age and are undergoing cancer treatment, have chronic lung disease, or have other medical conditions that affect your immune system. Ask your health care provider if this applies to you. There are different types and schedules of pneumococcal vaccines. Ask your health care provider which vaccination option is best for you. You may also prevent community-acquired pneumonia if you take these actions:  Get an influenza vaccine every year. Ask your health care provider which type of influenza vaccine is best for you.  Go to the dentist on a regular basis.  Wash your hands often. Use hand sanitizer if soap and water are not available. SEEK MEDICAL CARE IF:  You have a fever.  You are losing sleep because you cannot control your cough with cough medicine. SEEK IMMEDIATE MEDICAL CARE IF:  You have worsening shortness of breath.  You have increased chest pain.  Your sickness becomes worse, especially if you are an older adult or have a weakened immune system.  You cough up blood.   This information is not intended to replace advice given to you by your health care provider. Make sure you discuss any questions you have with your health care provider.   Document Released: 08/17/2005 Document Revised: 05/08/2015 Document Reviewed: 12/12/2014 Elsevier Interactive Patient Education [employer]. ,   Chest x-ray revealed a small pneumonia in the left base of your lung. You'll need a repeat chest x-ray in 2-3 weeks to make sure this is improved. Your potassium is low. Additionally your kidney function is slightly abnormal. These will all need to be rechecked. Prescription for antibiotic, inhaler, cough syrup, prednisone, potassium. Follow-up your primary care doctor or  return if worse.

## 2016-01-17 NOTE — ED Notes (Signed)
Pt sent from PCP for eval of cough and fever with some nausea; pt sts fever x 5 days

## 2016-01-20 ENCOUNTER — Ambulatory Visit (INDEPENDENT_AMBULATORY_CARE_PROVIDER_SITE_OTHER): Payer: Managed Care, Other (non HMO) | Admitting: Medical

## 2016-01-20 ENCOUNTER — Encounter: Payer: Self-pay | Admitting: Medical

## 2016-01-20 ENCOUNTER — Telehealth (HOSPITAL_BASED_OUTPATIENT_CLINIC_OR_DEPARTMENT_OTHER): Payer: Self-pay | Admitting: Emergency Medicine

## 2016-01-20 VITALS — BP 138/92 | HR 73 | Temp 98.4°F | Resp 20 | Wt 193.0 lb

## 2016-01-20 DIAGNOSIS — R0602 Shortness of breath: Secondary | ICD-10-CM

## 2016-01-20 DIAGNOSIS — M79604 Pain in right leg: Secondary | ICD-10-CM

## 2016-01-20 DIAGNOSIS — M7989 Other specified soft tissue disorders: Secondary | ICD-10-CM

## 2016-01-20 DIAGNOSIS — R05 Cough: Secondary | ICD-10-CM

## 2016-01-20 DIAGNOSIS — Z789 Other specified health status: Secondary | ICD-10-CM | POA: Diagnosis not present

## 2016-01-20 DIAGNOSIS — R059 Cough, unspecified: Secondary | ICD-10-CM

## 2016-01-20 DIAGNOSIS — R000 Tachycardia, unspecified: Secondary | ICD-10-CM

## 2016-01-20 DIAGNOSIS — M79601 Pain in right arm: Secondary | ICD-10-CM

## 2016-01-20 DIAGNOSIS — R0600 Dyspnea, unspecified: Secondary | ICD-10-CM

## 2016-01-20 NOTE — Progress Notes (Signed)
Subjective: Chief Complaint  Patient presents with  . Follow-up    hospital f/up. has pnemonia. went to [facility] so records in system. fever keeps coming back. not feeling better at all. has not been able to start prednisone because pharmacy had to order it.    Here for hospital f/u . Went to [facility] ED over the weekend for ongoing cough, not feeling well, chills, muscle aches.   Was diagnosed with pneumonia.  Was given IV fluids, IV antibiotics.   Started back in December with allergies, cough, congestion, was initially given cough syrup by [facility] in December.   In march was seen again by [facility], was given prednisone then.  Went on vacation to [place] had some improvement while there, but still coughing all of April.  Saw Dr. Redmond School here late April, was given nasal spray which helped the nasal congestion, Prilosec for gagging?  Came in here last Friday, was sent to the ED at that time.  Since the ED visit, fever has improved, but a night time still having a lot of cough.  No hx/o asthma.  Using inhaler q6 hours.  Of note, brother died recent of mesothelioma.  She does endorse going to [place] around [place], had prolonged bed rest in the last month, had dental implant surgery recently.  No OCPs/hormones.   No hx/o DVT or stroke.  No family hx/o DVT/PE.  No other aggravating or relieving factors. No other complaint.   Past Medical History  Diagnosis Date  . Wears glasses   . H/O mammogram 2013  . Insomnia   . Allergy    ROS as in subjective   Objective; BP 138/92 mmHg  Pulse 73  Temp(Src) 98.4 F (36.9 C) (Tympanic)  Resp 20  Wt 193 lb (87.544 kg)  SpO2 97%  LMP 01/10/2016  General appearance: Alert, WD/WN, no distress, ill appearing, coughing a lot                             Skin: warm, no rash, no diaphoresis                           Head: no sinus tenderness                            Eyes: conjunctiva normal, corneas clear, PERRLA              Ears: pearly TMs, external ear canals normal                          Nose: septum midline, turbinates normal, no erythema or discharge             Mouth/throat: MMM, tongue normal, no pharyngeal erythema                           Neck: supple, no adenopathy, no thyromegaly, non tender, no JVD                          Heart: RRR, normal S1, S2, no murmurs                         Lungs: somewhat decreased breath sounds, few crackles in lower fields bilat,  no rhonchi                Extremities: right calve is asymmetrically larger than left with mild tenderness, but no erythema, no warmth, no palpable cord     Assessment: Encounter Diagnoses  Name Primary?  . Cough Yes  . Dyspnea   . Leg swelling   . Leg pain, inferior, right   . Recent foreign travel       Plan: Reviewed recent ED report, labs.   She is somewhat improved.  C/t current medications, Levaquin, Prednisone, hycodan cough syrup, albuterol q4-6 hours.   Will check d-dimer and leg [place] to rule out DVT.   If +, will need chest CT.  She has risk factors for DVT/PE.   Will also need BMET lab in 2 weeks.    F/u pending studies.  D was seen today for follow-up.  Diagnoses and all orders for this visit:  Cough -     D-dimer, quantitative (not at [facility]) -     US Venous Img Lower Unilateral Left; Future  Dyspnea -     D-dimer, quantitative (not at [facility]) -     US Venous Img Lower Unilateral Left; Future  Leg swelling -     D-dimer, quantitative (not at [facility]) -     US Venous Img Lower Unilateral Left; Future  Leg pain, inferior, right -     D-dimer, quantitative (not at [facility]) -     US Venous Img Lower Unilateral Left; Future  Recent foreign travel -     D-dimer, quantitative (not at [facility]) -     US Venous Img Lower Unilateral Left; Future

## 2016-01-20 NOTE — Addendum Note (Signed)
Addended by: Billie Lade on: 01/20/2016 04:18 PM   Modules accepted: Orders

## 2016-01-20 NOTE — Addendum Note (Signed)
Addended by: Billie Lade on: 01/20/2016 04:08 PM   Modules accepted: Orders

## 2016-01-21 LAB — D-DIMER, QUANTITATIVE (NOT AT ARMC): D DIMER QUANT: 0.39 ug{FEU}/mL (ref 0.00–0.48)

## 2016-01-21 NOTE — Addendum Note (Signed)
Addended by: Billie Lade on: 01/21/2016 10:34 AM   Modules accepted: Orders

## 2016-01-21 NOTE — Addendum Note (Signed)
Addended by: Billie Lade on: 01/21/2016 09:34 AM   Modules accepted: Orders

## 2016-01-22 ENCOUNTER — Encounter (HOSPITAL_COMMUNITY): Payer: Self-pay

## 2016-01-22 ENCOUNTER — Emergency Department (HOSPITAL_COMMUNITY): Payer: Managed Care, Other (non HMO)

## 2016-01-22 ENCOUNTER — Emergency Department (HOSPITAL_COMMUNITY)
Admission: EM | Admit: 2016-01-22 | Discharge: 2016-01-23 | Disposition: A | Discharge status: Home / Self Care | Payer: Managed Care, Other (non HMO) | Attending: Emergency Medicine | Admitting: Emergency Medicine

## 2016-01-22 DIAGNOSIS — J159 Unspecified bacterial pneumonia: Secondary | ICD-10-CM | POA: Insufficient documentation

## 2016-01-22 DIAGNOSIS — Z973 Presence of spectacles and contact lenses: Secondary | ICD-10-CM | POA: Diagnosis not present

## 2016-01-22 DIAGNOSIS — Z3202 Encounter for pregnancy test, result negative: Secondary | ICD-10-CM | POA: Diagnosis not present

## 2016-01-22 DIAGNOSIS — Z8669 Personal history of other diseases of the nervous system and sense organs: Secondary | ICD-10-CM | POA: Insufficient documentation

## 2016-01-22 DIAGNOSIS — M79661 Pain in right lower leg: Secondary | ICD-10-CM | POA: Insufficient documentation

## 2016-01-22 DIAGNOSIS — Z79899 Other long term (current) drug therapy: Secondary | ICD-10-CM | POA: Insufficient documentation

## 2016-01-22 DIAGNOSIS — R0602 Shortness of breath: Secondary | ICD-10-CM | POA: Diagnosis present

## 2016-01-22 DIAGNOSIS — R630 Anorexia: Secondary | ICD-10-CM | POA: Insufficient documentation

## 2016-01-22 DIAGNOSIS — J189 Pneumonia, unspecified organism: Secondary | ICD-10-CM

## 2016-01-22 LAB — URINALYSIS, ROUTINE W REFLEX MICROSCOPIC
Glucose, UA: NEGATIVE mg/dL
Hgb urine dipstick: NEGATIVE
KETONES UR: 15 mg/dL — AB
LEUKOCYTES UA: NEGATIVE
NITRITE: NEGATIVE
PH: 5.5 (ref 5.0–8.0)
Protein, ur: NEGATIVE mg/dL
SPECIFIC GRAVITY, URINE: 1.035 — AB (ref 1.005–1.030)

## 2016-01-22 LAB — CBC WITH DIFFERENTIAL/PLATELET
Basophils Absolute: 0 10*3/uL (ref 0.0–0.1)
Basophils Relative: 0 %
Eosinophils Absolute: 0 10*3/uL (ref 0.0–0.7)
Eosinophils Relative: 0 %
HEMATOCRIT: 41 % (ref 36.0–46.0)
Hemoglobin: 14.3 g/dL (ref 12.0–15.0)
LYMPHS PCT: 13 %
Lymphs Abs: 1.9 10*3/uL (ref 0.7–4.0)
MCH: 30.7 pg (ref 26.0–34.0)
MCHC: 34.9 g/dL (ref 30.0–36.0)
MCV: 88 fL (ref 78.0–100.0)
MONO ABS: 1.4 10*3/uL — AB (ref 0.1–1.0)
MONOS PCT: 10 %
NEUTROS ABS: 11.3 10*3/uL — AB (ref 1.7–7.7)
Neutrophils Relative %: 77 %
Platelets: 370 10*3/uL (ref 150–400)
RBC: 4.66 MIL/uL (ref 3.87–5.11)
RDW: 12.6 % (ref 11.5–15.5)
WBC: 14.7 10*3/uL — ABNORMAL HIGH (ref 4.0–10.5)

## 2016-01-22 LAB — COMPREHENSIVE METABOLIC PANEL
ALK PHOS: 45 U/L (ref 38–126)
ALT: 29 U/L (ref 14–54)
ANION GAP: 10 (ref 5–15)
AST: 20 U/L (ref 15–41)
Albumin: 3.7 g/dL (ref 3.5–5.0)
BUN: 19 mg/dL (ref 6–20)
CALCIUM: 9.4 mg/dL (ref 8.9–10.3)
CO2: 22 mmol/L (ref 22–32)
Chloride: 102 mmol/L (ref 101–111)
Creatinine, Ser: 1.07 mg/dL — ABNORMAL HIGH (ref 0.44–1.00)
GFR, EST NON AFRICAN AMERICAN: 60 mL/min — AB (ref >60)
Glucose, Bld: 102 mg/dL — ABNORMAL HIGH (ref 65–99)
Potassium: 4.1 mmol/L (ref 3.5–5.1)
SODIUM: 134 mmol/L — AB (ref 135–145)
Total Bilirubin: 1.2 mg/dL (ref 0.3–1.2)
Total Protein: 7.4 g/dL (ref 6.5–8.1)

## 2016-01-22 LAB — POC URINE PREG, ED: Preg Test, Ur: NEGATIVE

## 2016-01-22 LAB — I-STAT CG4 LACTIC ACID, ED: Lactic Acid, Venous: 1.46 mmol/L (ref 0.5–2.0)

## 2016-01-22 MED ORDER — IOPAMIDOL (ISOVUE-370) INJECTION 76%
INTRAVENOUS | Status: AC
  Administered 2016-01-22: 100 mL
  Filled 2016-01-22: qty 100

## 2016-01-22 MED ORDER — DEXTROSE 5 % IV SOLN
500.0000 mg | Freq: Once | INTRAVENOUS | Status: AC
  Administered 2016-01-22: 500 mg via INTRAVENOUS
  Filled 2016-01-22: qty 500

## 2016-01-22 MED ORDER — SODIUM CHLORIDE 0.9 % IV BOLUS (SEPSIS)
1000.0000 mL | Freq: Once | INTRAVENOUS | Status: AC
  Administered 2016-01-22: 1000 mL via INTRAVENOUS

## 2016-01-22 MED ORDER — DEXTROSE 5 % IV SOLN
1.0000 g | Freq: Once | INTRAVENOUS | Status: AC
  Administered 2016-01-22: 1 g via INTRAVENOUS
  Filled 2016-01-22: qty 10

## 2016-01-22 NOTE — ED Notes (Signed)
Patient transported to CT 

## 2016-01-22 NOTE — ED Notes (Signed)
Iv team here 

## 2016-01-22 NOTE — ED Notes (Signed)
Pt's son stepping outside, may be going home to check on his children. Asks that we call him to let him know what pt's CT scan reveals, and if she will be admitted this evening.  [place]   [phone]

## 2016-01-22 NOTE — ED Notes (Signed)
Ambulated Patient Approx. 211ft. Resting SpO2: 99% /// Resting HR: 90 bpm Ambulating SpO2: 97% /// Ambulating HR: 106 Pt. had a steady gait and did not require assistance.

## 2016-01-22 NOTE — ED Notes (Signed)
C/o not being able to sleep for several days.   She has been diagnosed with pneumonia several days ago  C/o continuous coughing and cannot breathe at night unless she sits upright  Coughing almost continuously

## 2016-01-22 NOTE — ED Notes (Signed)
Pt. Reports having sob , cough and fever, Has been recently diagnosed with pneumonia  And is not feeling any better.  Pt. Is in NAD  PT. Having  Dry non-productive.   Skin iws warm and dry.
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
## 2016-01-22 NOTE — ED Notes (Signed)
EDP made aware pt only has 22 gauge in forearm, not adequate for CT. Ultrasound/IV supplies set up at bedside for attempt by EDP to start.

## 2016-01-23 MED ORDER — BENZONATATE 100 MG PO CAPS: 100.0000 mg | ORAL_CAPSULE | Freq: Three times a day (TID) | ORAL | Status: DC

## 2016-01-23 MED ORDER — LEVOFLOXACIN 750 MG PO TABS: 750.0000 mg | ORAL_TABLET | Freq: Every day | ORAL | Status: AC

## 2016-01-23 NOTE — ED Notes (Signed)
Pt stable, ambulatory, states understanding of discharge instructions 

## 2016-01-27 LAB — CULTURE, BLOOD (ROUTINE X 2)
Culture: NO GROWTH
Culture: NO GROWTH

## 2016-01-28 ENCOUNTER — Telehealth: Payer: Self-pay

## 2016-01-28 DIAGNOSIS — E0789 Other specified disorders of thyroid: Secondary | ICD-10-CM

## 2016-01-28 DIAGNOSIS — D497 Neoplasm of unspecified behavior of endocrine glands and other parts of nervous system: Secondary | ICD-10-CM

## 2016-01-28 NOTE — Telephone Encounter (Signed)
Stated that she needs an u/s on her "thyroid and liver nodules" per the emergency department. Wants to have that done as soon as possible

## 2016-01-28 NOTE — Telephone Encounter (Signed)
Looking through her notes, we had ordered ultrasound of leg and CT as of my last correspond ance.  When was the CT originally scheduled?  I know I was awaiting results, but in the meantime she apparently went ED on 01/22/16 and CT was done there along with other evaluation.   Lets have her f/u.  I believe she normally sees Dr. Redmond School.   We can go ahead and schedule thyroid ultraous, but have her come in for ED f/u and discussion of results, labs, imaging, and other next steps.  Sometimes when we scan one area (such as chest), we have other findings that weren't expected, and this sometimes creates unnecessary testing.

## 2016-01-29 ENCOUNTER — Ambulatory Visit (HOSPITAL_COMMUNITY)
Admission: RE | Admit: 2016-01-29 | Discharge: 2016-01-29 | Disposition: A | Discharge status: Home / Self Care | Payer: Managed Care, Other (non HMO) | Source: Ambulatory Visit | Attending: Medical | Admitting: Medical

## 2016-01-29 DIAGNOSIS — M79609 Pain in unspecified limb: Secondary | ICD-10-CM | POA: Diagnosis present

## 2016-01-29 DIAGNOSIS — M7989 Other specified soft tissue disorders: Secondary | ICD-10-CM

## 2016-01-29 NOTE — Telephone Encounter (Signed)
Thyroid US appt is being made and pt is coming in tomorrow to see Dr Redmond School

## 2016-01-29 NOTE — Addendum Note (Signed)
Addended by: Billie Lade on: 01/29/2016 09:01 AM   Modules accepted: Orders

## 2016-01-30 ENCOUNTER — Encounter: Payer: Self-pay | Admitting: Family Medicine

## 2016-01-30 ENCOUNTER — Ambulatory Visit (INDEPENDENT_AMBULATORY_CARE_PROVIDER_SITE_OTHER): Payer: Managed Care, Other (non HMO) | Admitting: Family Medicine

## 2016-01-30 ENCOUNTER — Other Ambulatory Visit: Payer: Managed Care, Other (non HMO)

## 2016-01-30 ENCOUNTER — Ambulatory Visit
Admission: RE | Admit: 2016-01-30 | Discharge: 2016-01-30 | Disposition: A | Discharge status: Home / Self Care | Payer: 59 | Source: Ambulatory Visit | Attending: Medical | Admitting: Medical

## 2016-01-30 ENCOUNTER — Ambulatory Visit
Admission: RE | Admit: 2016-01-30 | Discharge: 2016-01-30 | Disposition: A | Discharge status: Home / Self Care | Payer: 59 | Source: Ambulatory Visit | Attending: Family Medicine | Admitting: Family Medicine

## 2016-01-30 VITALS — BP 112/64 | HR 93 | Wt 189.0 lb

## 2016-01-30 DIAGNOSIS — K7689 Other specified diseases of liver: Secondary | ICD-10-CM | POA: Diagnosis not present

## 2016-01-30 DIAGNOSIS — E0789 Other specified disorders of thyroid: Secondary | ICD-10-CM

## 2016-01-30 DIAGNOSIS — K769 Liver disease, unspecified: Secondary | ICD-10-CM

## 2016-01-30 DIAGNOSIS — D497 Neoplasm of unspecified behavior of endocrine glands and other parts of nervous system: Secondary | ICD-10-CM

## 2016-01-30 NOTE — Progress Notes (Signed)
   Subjective:    Patient ID: Stacie Oconnell, female    DOB: Jan 10, 1966, 50 y.o.   MRN: [id number]  HPI She is here for follow-up visit. She recently had difficulty with pneumonia and as part of that evaluation, a CT scan was done which did show a thyroid nodule as well as one in her liver. She is scheduled for an ultrasound today for her thyroid nodule. Presently she is having no fever, chills, cough or congestion. She feels that her pneumonia is now completely resolved.   Review of Systems     Objective:   Physical Exam Alert and in no distress. Lungs are clear to auscultation. Cardiac exam shows regular rhythm without murmurs or gallops. Thyroid exam showed no palpable lesion.       Assessment & Plan:  Thyroid incidentaloma  Liver lesion - Plan: US Abdomen Limited  She was scheduled for the thyroid scan today so I will order the ultrasound of her liver as well

## 2016-02-03 ENCOUNTER — Telehealth: Payer: Self-pay

## 2016-02-03 DIAGNOSIS — E041 Nontoxic single thyroid nodule: Secondary | ICD-10-CM

## 2016-02-03 NOTE — Telephone Encounter (Signed)
Set her up for thyroid biopsy with [facility], and the liver ultrasound just shows a cyst, not considered worrisome.

## 2016-02-03 NOTE — Telephone Encounter (Signed)
Patient called and wanted her u/s results Stacie Oconnell had called and left her a message to call back I gave her results of the thyroid u/s she said to go ahead and set up her biopsy as soon as you can and she would like to get results for the liver u/s

## 2016-02-04 ENCOUNTER — Telehealth: Payer: Self-pay

## 2016-02-04 NOTE — Telephone Encounter (Signed)
Thyroid biopsy 02/13/16 @ am [address] location. Pt aware. Also wants to know what should she do about cyst or will it dissipate on its own

## 2016-02-04 NOTE — Telephone Encounter (Signed)
There is nothing to do about the cyst.  It will probably not change, appears benign and an incidental finding.

## 2016-02-04 NOTE — Addendum Note (Signed)
Addended by: Billie Lade on: 02/04/2016 10:57 AM   Modules accepted: Orders

## 2016-02-04 NOTE — Telephone Encounter (Signed)
Pt called and request a callback as soon as you can. She has multiple questions about liver ultrasound and thyroid biopsy. She wants the biopsy this week. Stacie Oconnell

## 2016-02-05 NOTE — Telephone Encounter (Signed)
Pt stated that she wants it explained to her how we can be unsure when seeing a thyroid nodule but be sure that the liver cyst is benign. She stated she wants that explained and all things documented so that she can have it in her chart for later use.

## 2016-02-10 NOTE — Telephone Encounter (Signed)
I discussed the thyroid and liver findings.  Gave her the option of repeat ultrasound of the liver cyst in 6-12 mo to ensure stability.   She will let us know.   It appears benign though.    She has thyroid nodule FNA this week scheduled

## 2016-02-13 ENCOUNTER — Ambulatory Visit
Admission: RE | Admit: 2016-02-13 | Discharge: 2016-02-13 | Disposition: A | Discharge status: Home / Self Care | Payer: 59 | Source: Ambulatory Visit | Attending: Medical | Admitting: Medical

## 2016-02-13 ENCOUNTER — Other Ambulatory Visit (HOSPITAL_COMMUNITY)
Admission: RE | Admit: 2016-02-13 | Discharge: 2016-02-13 | Disposition: A | Discharge status: Home / Self Care | Payer: 59 | Source: Ambulatory Visit | Attending: General Surgery | Admitting: General Surgery

## 2016-02-13 DIAGNOSIS — E041 Nontoxic single thyroid nodule: Secondary | ICD-10-CM | POA: Diagnosis not present

## 2016-02-17 ENCOUNTER — Telehealth: Payer: Self-pay

## 2016-02-17 NOTE — Telephone Encounter (Signed)
Left message on machine thyroid biopsy per Dr.Lalonde was normal

## 2016-11-12 ENCOUNTER — Encounter: Payer: Self-pay | Admitting: Gastroenterology

## 2016-11-13 ENCOUNTER — Telehealth: Payer: Self-pay

## 2016-11-13 NOTE — Telephone Encounter (Signed)
Pt aware she has been dismissed from the office and request copy of her medical records. Advised pt records ready for pick up and there is a $10 copying fee. Victor[place] December

## 2016-11-16 ENCOUNTER — Other Ambulatory Visit: Payer: Self-pay | Admitting: Physician Assistant

## 2016-11-16 DIAGNOSIS — Z0279 Encounter for issue of other medical certificate: Secondary | ICD-10-CM

## 2016-11-16 DIAGNOSIS — N63 Unspecified lump in unspecified breast: Secondary | ICD-10-CM

## 2016-11-19 ENCOUNTER — Other Ambulatory Visit: Payer: Self-pay | Admitting: Physician Assistant

## 2016-11-19 DIAGNOSIS — N63 Unspecified lump in unspecified breast: Secondary | ICD-10-CM

## 2016-12-02 ENCOUNTER — Ambulatory Visit
Admission: RE | Admit: 2016-12-02 | Discharge: 2016-12-02 | Disposition: A | Discharge status: Home / Self Care | Payer: 59 | Source: Ambulatory Visit | Attending: Physician Assistant | Admitting: Physician Assistant

## 2016-12-02 DIAGNOSIS — N63 Unspecified lump in unspecified breast: Secondary | ICD-10-CM

## 2017-01-08 ENCOUNTER — Ambulatory Visit (AMBULATORY_SURGERY_CENTER): Payer: Self-pay | Admitting: *Deleted

## 2017-01-08 ENCOUNTER — Encounter: Payer: Self-pay | Admitting: Gastroenterology

## 2017-01-08 VITALS — Ht 66.0 in | Wt 206.8 lb

## 2017-01-08 DIAGNOSIS — Z1211 Encounter for screening for malignant neoplasm of colon: Secondary | ICD-10-CM

## 2017-01-08 MED ORDER — NA SULFATE-K SULFATE-MG SULF 17.5-3.13-1.6 GM/177ML PO SOLN: ORAL | 0 refills | Status: AC

## 2017-01-08 NOTE — Progress Notes (Signed)
No allergies to eggs or soy. No problems with anesthesia.  Pt given Emmi instructions for colonoscopy  No oxygen use  No diet drug use  

## 2017-01-15 ENCOUNTER — Ambulatory Visit (AMBULATORY_SURGERY_CENTER): Payer: Managed Care, Other (non HMO) | Admitting: Gastroenterology

## 2017-01-15 ENCOUNTER — Encounter: Payer: Self-pay | Admitting: Gastroenterology

## 2017-01-15 VITALS — BP 131/83 | HR 81 | Temp 97.8°F | Resp 14 | Ht 66.0 in | Wt 206.0 lb

## 2017-01-15 DIAGNOSIS — D122 Benign neoplasm of ascending colon: Secondary | ICD-10-CM

## 2017-01-15 DIAGNOSIS — Z1211 Encounter for screening for malignant neoplasm of colon: Secondary | ICD-10-CM

## 2017-01-15 DIAGNOSIS — Z1212 Encounter for screening for malignant neoplasm of rectum: Secondary | ICD-10-CM | POA: Diagnosis not present

## 2017-01-15 MED ORDER — SODIUM CHLORIDE 0.9 % IV SOLN: 500.0000 mL | INTRAVENOUS | Status: AC

## 2017-01-15 NOTE — Patient Instructions (Signed)
YOU HAD AN ENDOSCOPIC PROCEDURE TODAY AT THE [facility]:   Refer to the procedure report that was given to you for any specific questions about what was found during the examination.  If the procedure report does not answer your questions, please call your gastroenterologist to clarify.  If you requested that your care partner not be given the details of your procedure findings, then the procedure report has been included in a sealed envelope for you to review at your convenience later.  YOU SHOULD EXPECT: Some feelings of bloating in the abdomen. Passage of more gas than usual.  Walking can help get rid of the air that was put into your GI tract during the procedure and reduce the bloating. If you had a lower endoscopy (such as a colonoscopy or flexible sigmoidoscopy) you may notice spotting of blood in your stool or on the toilet paper. If you underwent a bowel prep for your procedure, you may not have a normal bowel movement for a few days.  Please Note:  You might notice some irritation and congestion in your nose or some drainage.  This is from the oxygen used during your procedure.  There is no need for concern and it should clear up in a day or so.  SYMPTOMS TO REPORT IMMEDIATELY:   Following lower endoscopy (colonoscopy or flexible sigmoidoscopy):  Excessive amounts of blood in the stool  Significant tenderness or worsening of abdominal pains  Swelling of the abdomen that is new, acute  Fever of 100F or higher   For urgent or emergent issues, a gastroenterologist can be reached at any hour by calling [phone]. Please read all handouts given to you by your recovery nurse.   DIET:  We do recommend a small meal at first, but then you may proceed to your regular diet.  Drink plenty of fluids but you should avoid alcoholic beverages for 24 hours.  ACTIVITY:  You should plan to take it easy for the rest of today and you should NOT DRIVE or use heavy machinery until  tomorrow (because of the sedation medicines used during the test).    FOLLOW UP: Our staff will call the number listed on your records the next business day following your procedure to check on you and address any questions or concerns that you may have regarding the information given to you following your procedure. If we do not reach you, we will leave a message.  However, if you are feeling well and you are not experiencing any problems, there is no need to return our call.  We will assume that you have returned to your regular daily activities without incident.  If any biopsies were taken you will be contacted by phone or by letter within the next 1-3 weeks.  Please call us at [phone] if you have not heard about the biopsies in 3 weeks.    SIGNATURES/CONFIDENTIALITY: You and/or your care partner have signed paperwork which will be entered into your electronic medical record.  These signatures attest to the fact that that the information above on your After Visit Summary has been reviewed and is understood.  Full responsibility of the confidentiality of this discharge information lies with you and/or your care-partner.  Thank you for letting us take care of your healthcare needs today. 

## 2017-01-15 NOTE — Progress Notes (Signed)
Pt's states no medical or surgical changes since previsit or office visit. 

## 2017-01-15 NOTE — Op Note (Signed)
[facility] Patient Name: Stacie Oconnell Procedure Date: 01/15/2017 9:35 AM MRN: [id number] Endoscopist: Mallie Mussel L. Loletha Carrow , MD Age: 51 Referring MD:  Date of Birth: 1965/09/01 Gender: Female Account #: [id number] Procedure:                Colonoscopy Indications:              Screening for colorectal malignant neoplasm, This                            is the patient's first colonoscopy Medicines:                Monitored Anesthesia Care Procedure:                Pre-Anesthesia Assessment:                           - Prior to the procedure, a History and Physical                            was performed, and patient medications and                            allergies were reviewed. The patient's tolerance of                            previous anesthesia was also reviewed. The risks                            and benefits of the procedure and the sedation                            options and risks were discussed with the patient.                            All questions were answered, and informed consent                            was obtained. Prior Anticoagulants: The patient has                            taken no previous anticoagulant or antiplatelet                            agents. ASA Grade Assessment: II - A patient with                            mild systemic disease. After reviewing the risks                            and benefits, the patient was deemed in                            satisfactory condition to undergo the procedure.  After obtaining informed consent, the colonoscope                            was passed under direct vision. Throughout the                            procedure, the patient's blood pressure, pulse, and                            oxygen saturations were monitored continuously. The                            Colonoscope was introduced through the anus and                            advanced to the the  cecum, identified by                            appendiceal orifice and ileocecal valve. The                            colonoscopy was performed without difficulty. The                            patient tolerated the procedure well. The quality                            of the bowel preparation was excellent. The                            ileocecal valve, appendiceal orifice, and rectum                            were photographed. The quality of the bowel                            preparation was evaluated using the BBPS [place]                            Bowel Preparation Scale) with scores of: Right                            Colon = 3, Transverse Colon = 3 and Left Colon = 3                            (entire mucosa seen well with no residual staining,                            small fragments of stool or opaque liquid). The                            total BBPS score equals 9. The bowel preparation  used was SUPREP. Scope In: 9:38:30 AM Scope Out: 9:50:32 AM Scope Withdrawal Time: 0 hours 10 minutes 16 seconds  Total Procedure Duration: 0 hours 12 minutes 2 seconds  Findings:                 The perianal and digital rectal examinations were                            normal.                           Two sessile polyps were found in the ascending                            colon. The polyps were 2 mm in size. These polyps                            were removed with a cold biopsy forceps. Resection                            and retrieval were complete.                           The exam was otherwise without abnormality on                            direct and retroflexion views. Complications:            No immediate complications. Estimated Blood Loss:     Estimated blood loss: none. Impression:               - Two 2 mm polyps in the ascending colon, removed                            with a cold biopsy forceps. Resected and retrieved.                            - The examination was otherwise normal on direct                            and retroflexion views. Recommendation:           - Patient has a contact number available for                            emergencies. The signs and symptoms of potential                            delayed complications were discussed with the                            patient. Return to normal activities tomorrow.                            Written discharge instructions were provided to the  patient.                           - Resume previous diet.                           - Continue present medications.                           - Await pathology results.                           - Repeat colonoscopy is recommended for                            surveillance. The colonoscopy date will be                            determined after pathology results from today's                            exam become available for review. Roxana Lai L. Loletha Carrow, MD 01/15/2017 9:53:54 AM This report has been signed electronically.

## 2017-01-15 NOTE — Progress Notes (Signed)
  [facility] Post-op Note  Patient: D-Arcy Aiken-Chen  Procedure(s) Performed: colonoscopy  Patient Location: [facility] - Recovery Area  Anesthesia Type: Deep Sedation/Propofol  Level of Consciousness: awake, oriented and patient cooperative  Airway and Oxygen Therapy: Patient Spontanous Breathing  Post-op Pain: none  Post-op Assessment:  Post-op Vital signs reviewed, Patient's Cardiovascular Status Stable, Respiratory Function Stable, Patent Airway, No signs of Nausea or vomiting and Pain level controlled  Post-op Vital Signs: Reviewed and stable  Complications: No apparent anesthesia complications  [place] Haub E Jihad Brownlow 9:56 AM

## 2017-01-15 NOTE — Progress Notes (Signed)
Called to room to assist during endoscopic procedure.  Patient ID and intended procedure confirmed with present staff. Received instructions for my participation in the procedure from the performing physician.  

## 2017-01-18 ENCOUNTER — Telehealth: Payer: Self-pay

## 2017-01-18 NOTE — Telephone Encounter (Signed)
  Follow up Call-  Call back number 01/15/2017  Post procedure Call Back phone  # [phone]  Permission to leave phone message Yes  Some recent data might be hidden     Patient questions:  Do you have a fever, pain , or abdominal swelling? No. Pain Score  0 *  Have you tolerated food without any problems? Yes.    Have you been able to return to your normal activities? Yes.    Do you have any questions about your discharge instructions: Diet   No. Medications  No. Follow up visit  No.  Do you have questions or concerns about your Care? No.  Actions: * If pain score is 4 or above: No action needed, pain <4.

## 2017-01-20 ENCOUNTER — Encounter: Payer: Self-pay | Admitting: Gastroenterology

## 2017-05-20 IMAGING — CR DG CHEST 2V
2 series · 2 of 2 positions shown · non-contrast
Comparison: None.

CLINICAL DATA: Cough x1 week, fever

EXAM:
CHEST  2 VIEW

[chest pa]
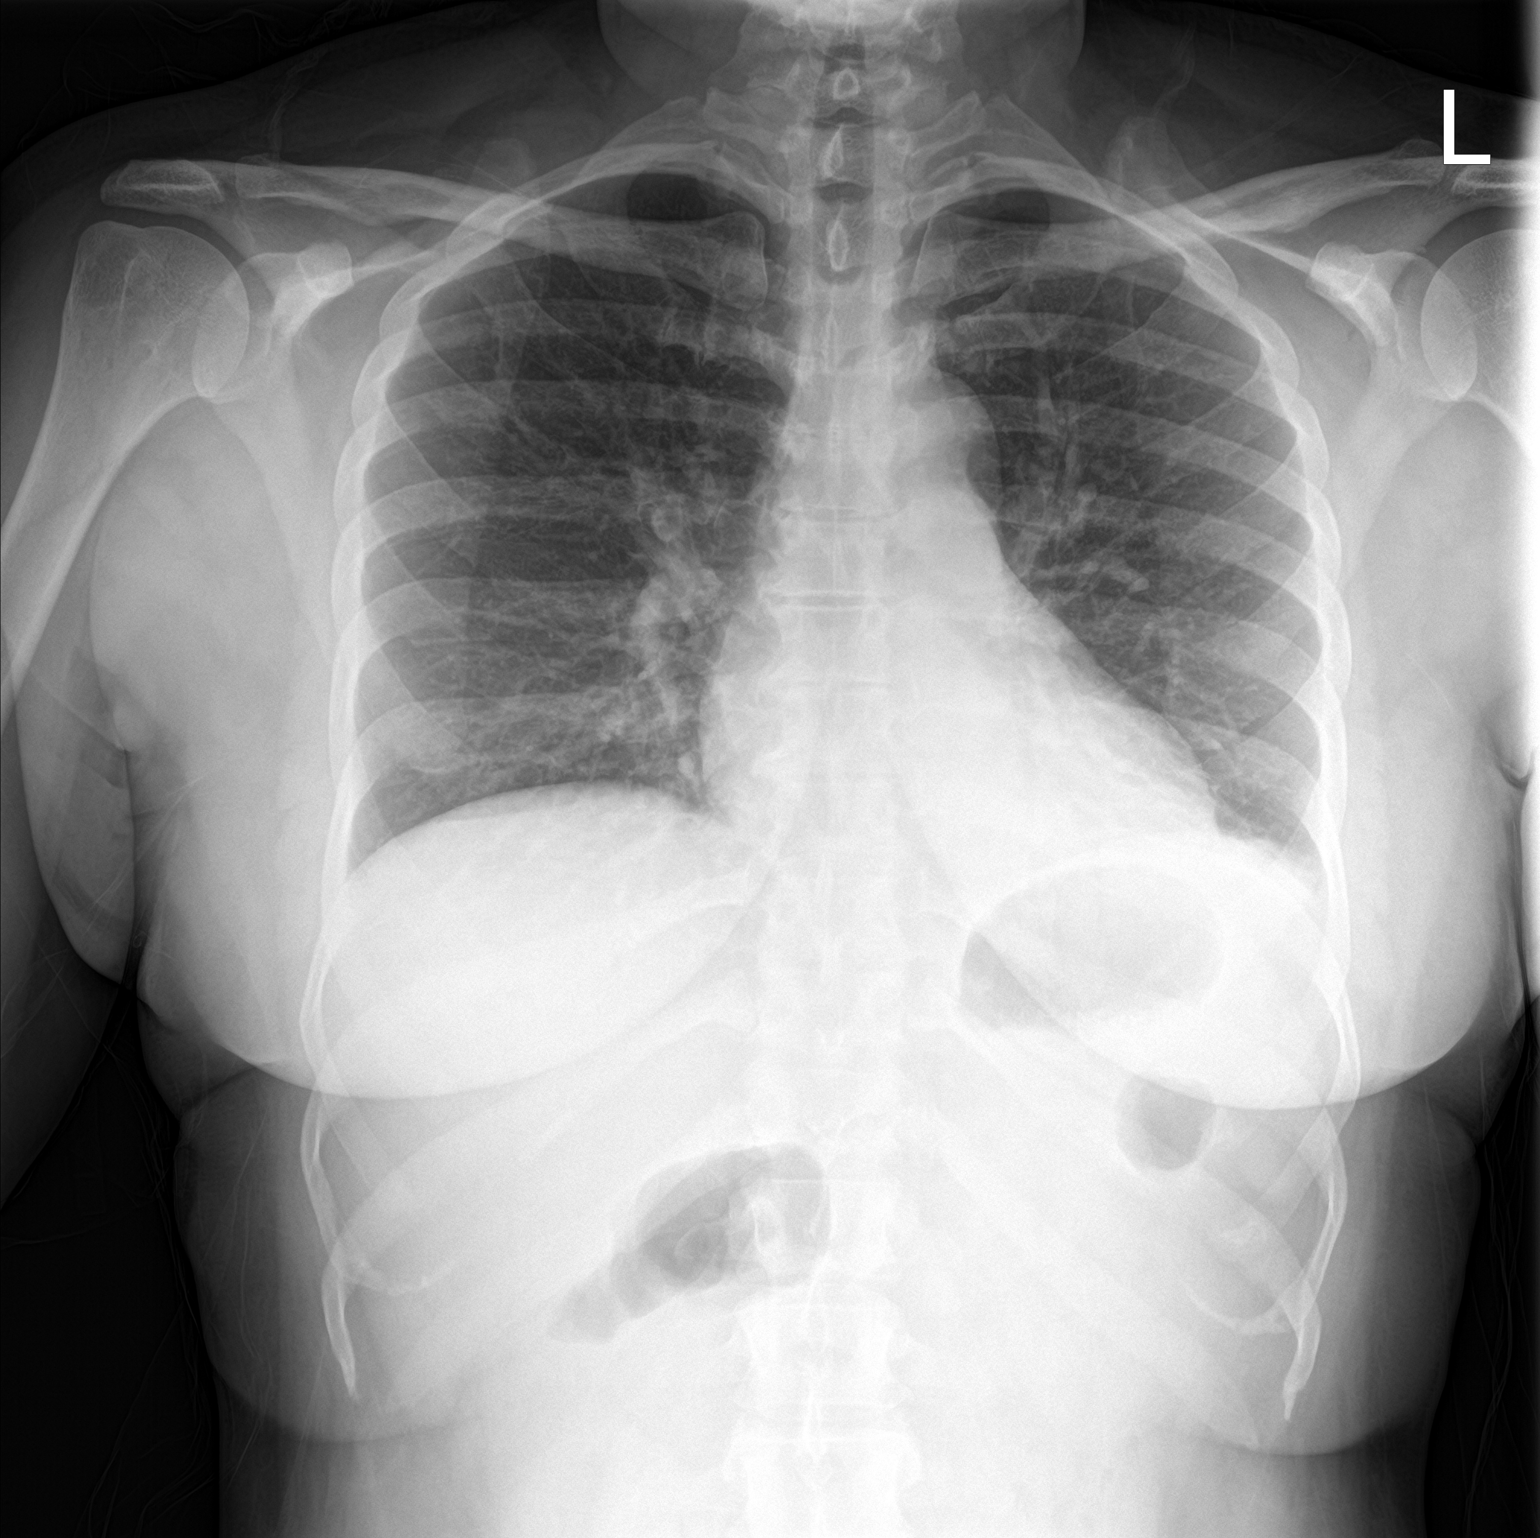

[chest lat]
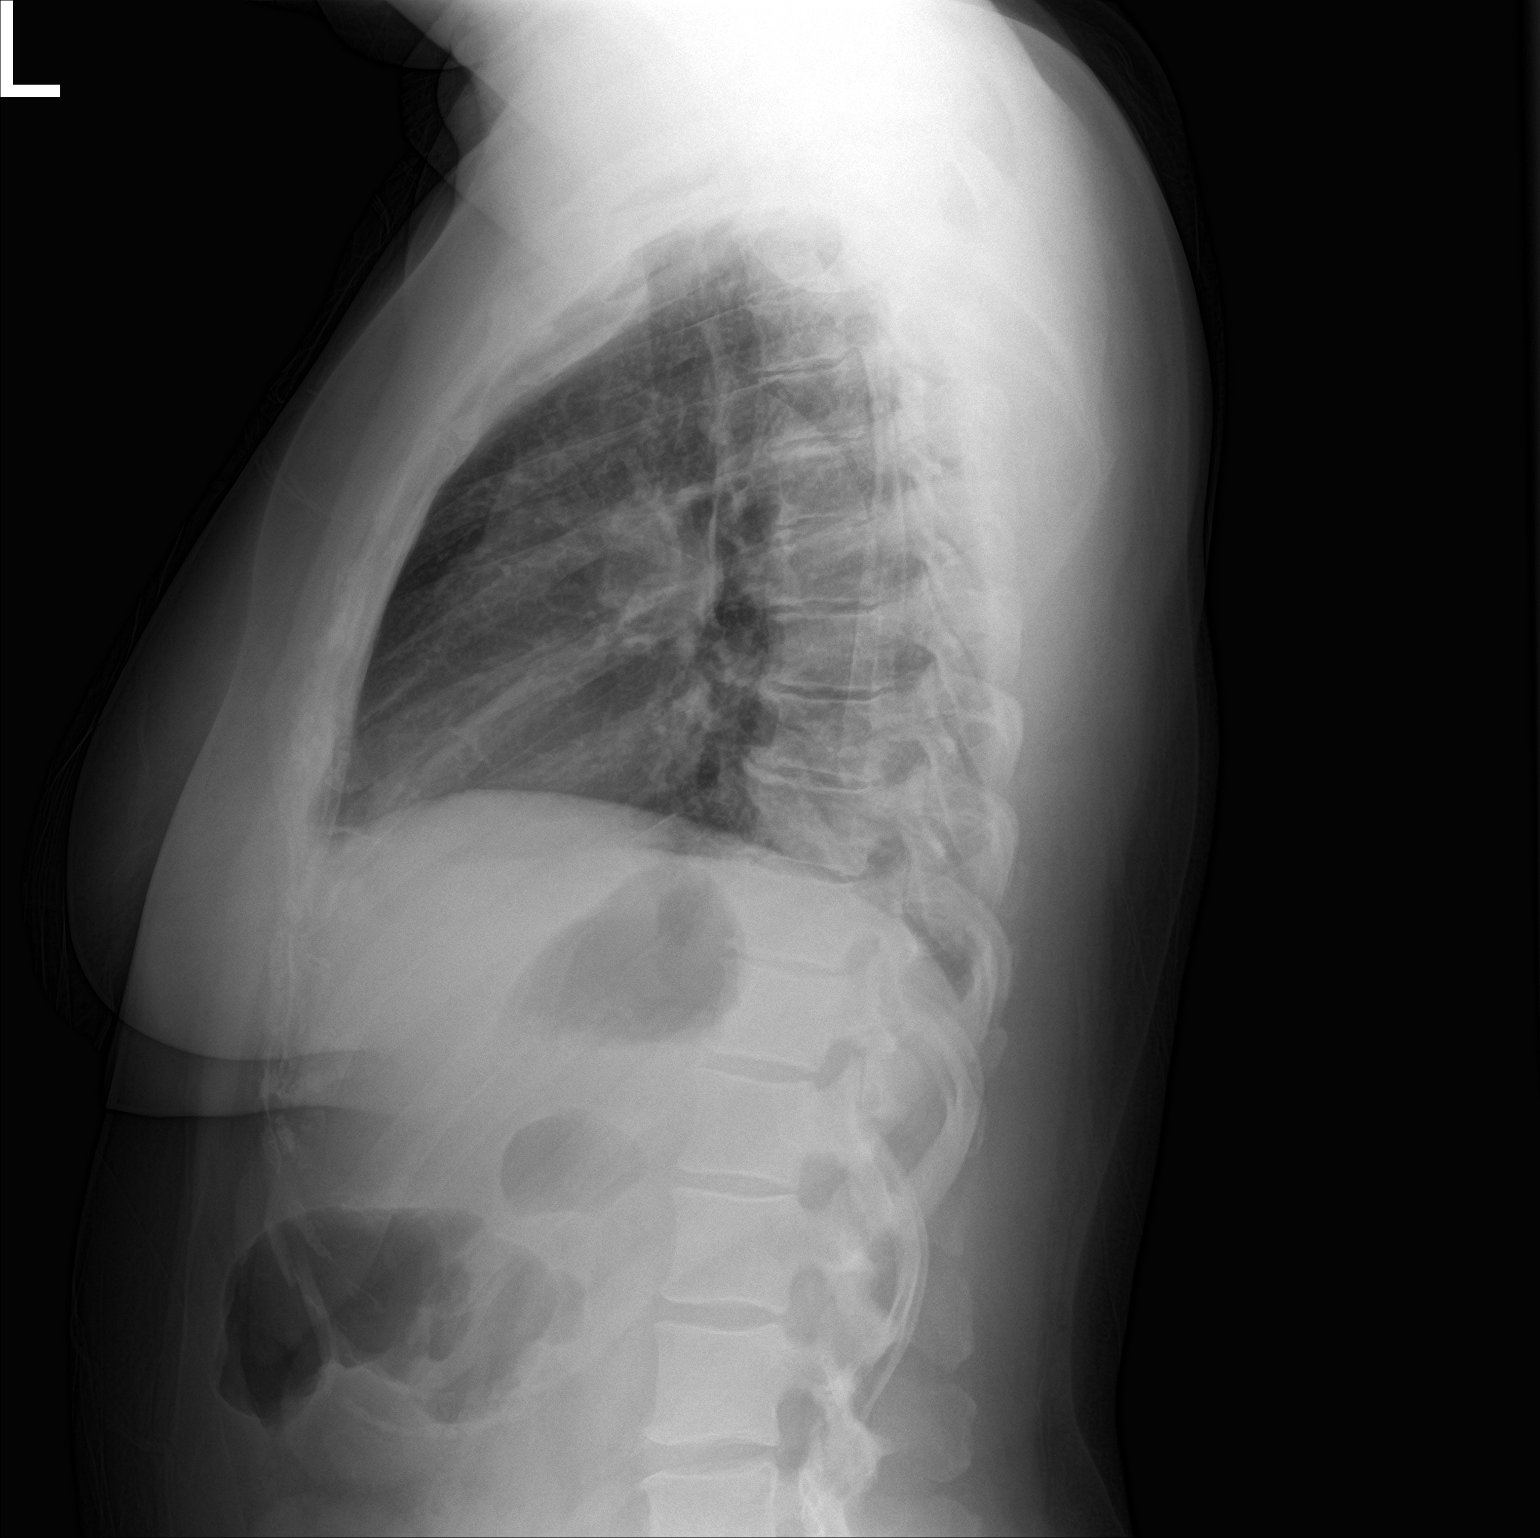

[2 of 2 positions shown; findings below may reference images not displayed]

FINDINGS: Patchy left lower lobe opacity, radiographically favoring
atelectasis, although pneumonia is not excluded given the clinical
history.

Right lung is clear.  No pleural effusion or pneumothorax.

The heart is normal in size.

Mild degenerative changes the lower lumbar spine.
IMPRESSION: Mild patchy left basilar opacity, atelectasis versus pneumonia.

## 2017-05-25 IMAGING — CT CT ANGIO CHEST
1 of 9 series · 13 of 36 positions shown · IV contrast (Iohexol (Omnipaque 350))
Comparison: Radiograph earlier this day and 01/17/2016

CLINICAL DATA: Cough and shortness of breath.

EXAM:
CT ANGIOGRAPHY CHEST WITH CONTRAST
TECHNIQUE: Multidetector CT imaging of the chest was performed using the
standard protocol during bolus administration of intravenous
contrast. Multiplanar CT image reconstructions and MIPs were
obtained to evaluate the vascular anatomy.
CONTRAST:  100 cc Isovue 370 IV

[Series 406: thins pacs · axial · 0.64mm/px · z∈[+687,+932]mm · 13 of 287 slices shown]
[im 21/287  lung]
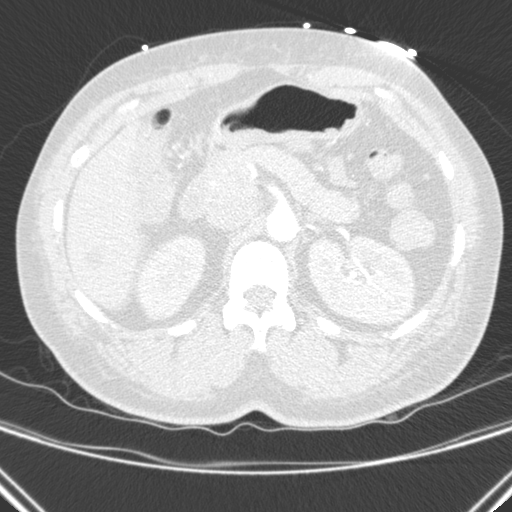
[im 41/287  mediastinal]
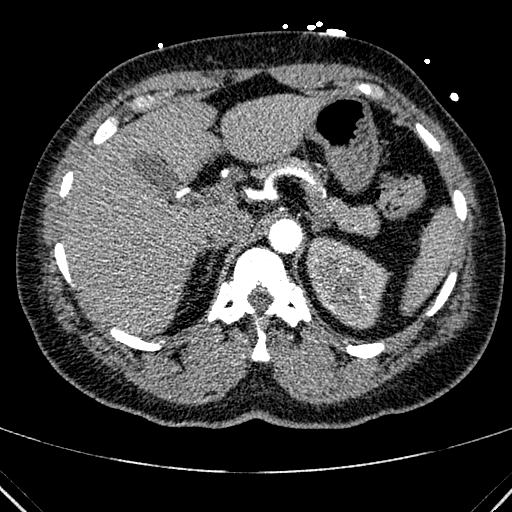
[im 62/287  lung]
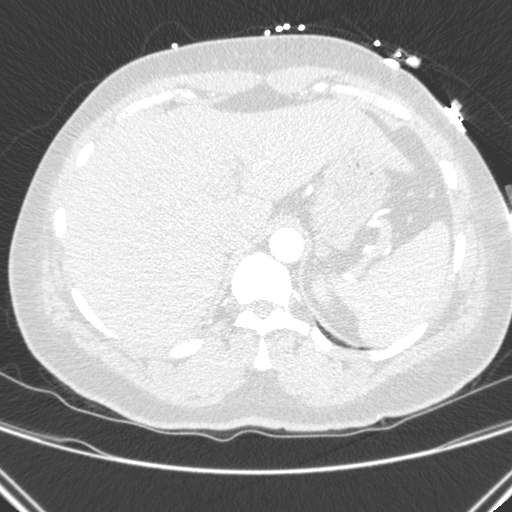
[im 82/287  mediastinal]
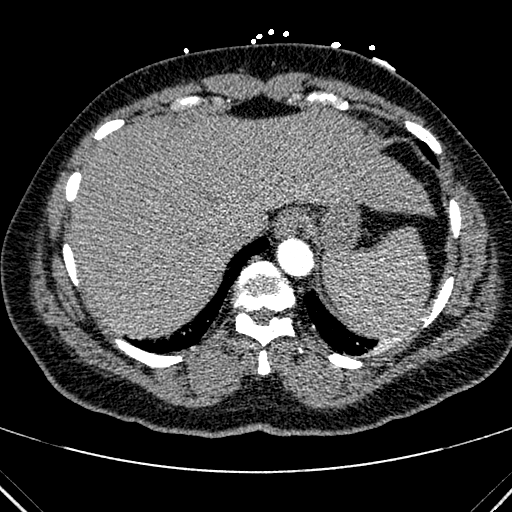
[im 103/287  lung]
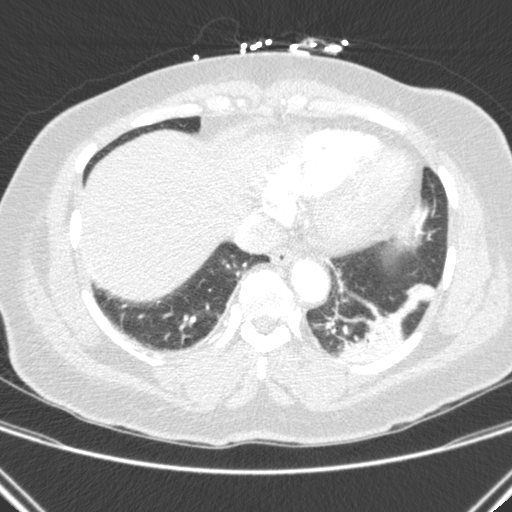
[im 123/287  mediastinal]
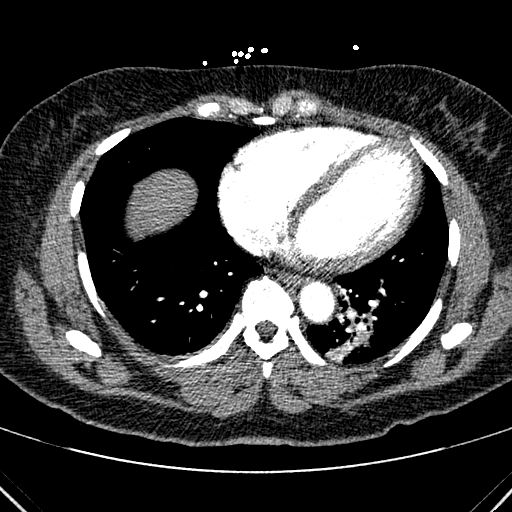
[im 144/287  lung]
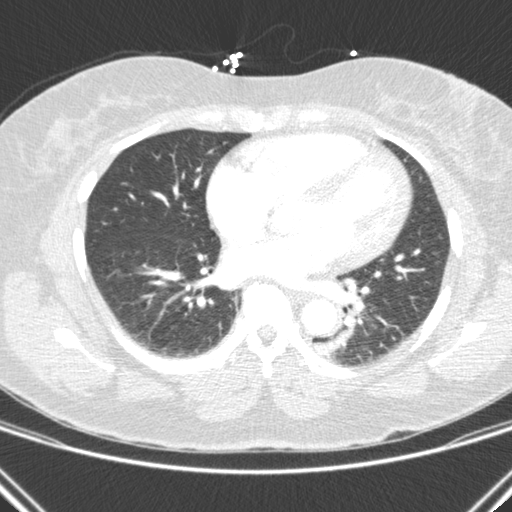
[im 164/287  mediastinal]
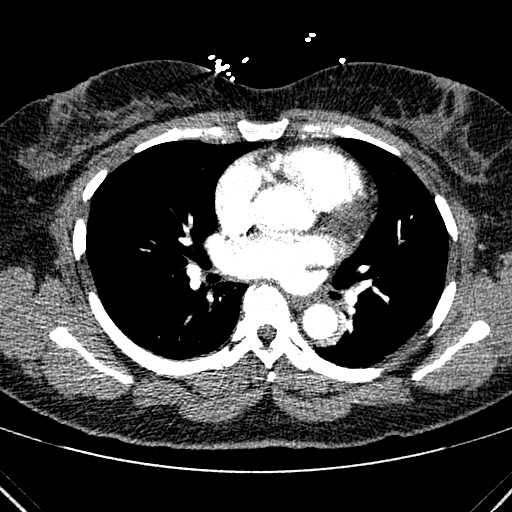
[im 184/287  lung]
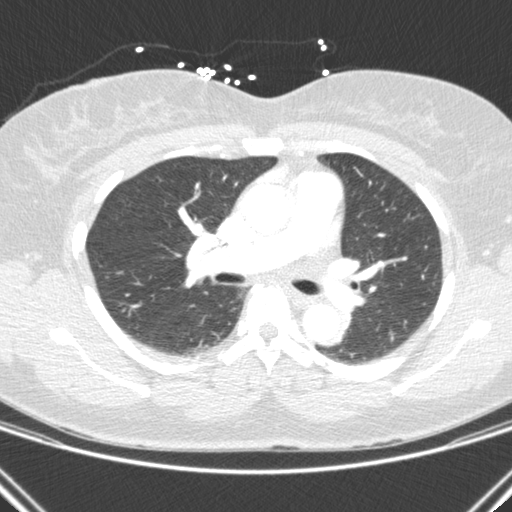
[im 205/287  mediastinal]
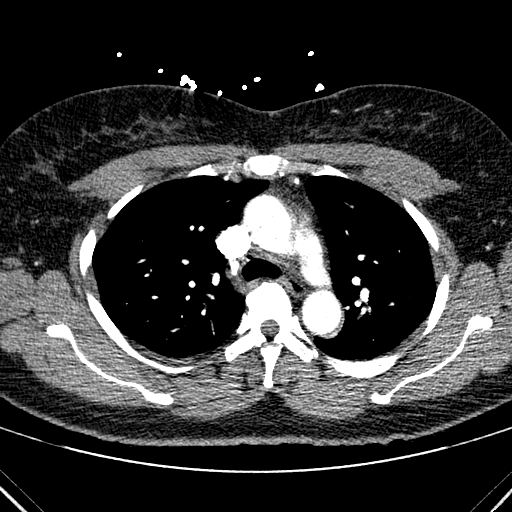
[im 225/287  lung]
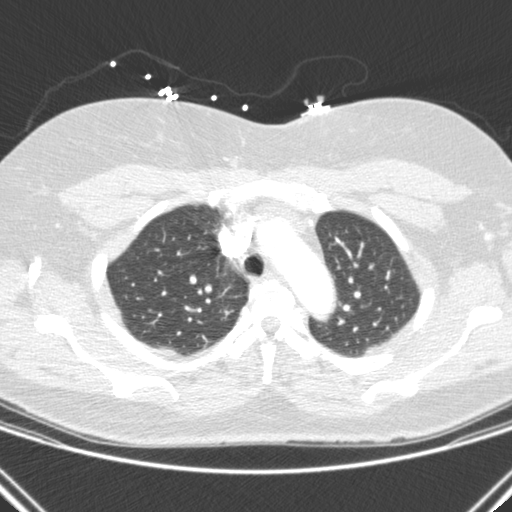
[im 246/287  mediastinal]
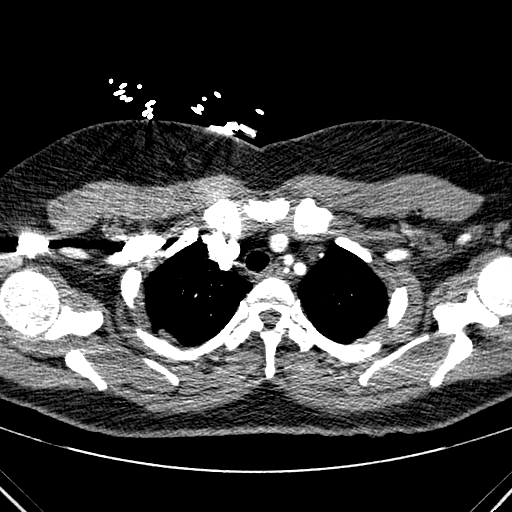
[im 266/287  lung]
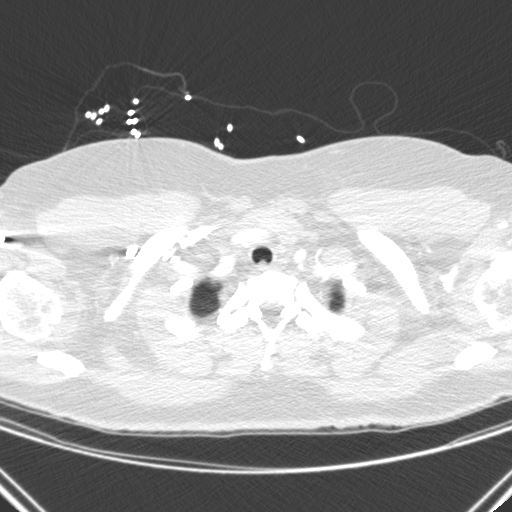

[13 of 36 positions shown; findings below may reference images not displayed]

FINDINGS: There are no filling defects within the pulmonary arteries to
suggest pulmonary embolus. Normal caliber thoracic aorta, mild
tortuosity of the descending portion. Common origin of the
brachiocephalic and right common carotid artery, the left vertebral
artery arises directly from the aorta. No mediastinal or hilar
adenopathy. No pericardial effusion.

Left lower lobe consolidation corresponding to abnormality on
radiograph consistent with pneumonia. The right lung is clear. No
pleural effusion.

There is a 10 mm hypodensity in the inferior right lobe of the
liver, incompletely characterized. No acute abnormality in the upper
abdomen.

Hypodensity in the lower left thyroid gland measures 2.4 cm, unclear
whether this represents single nodule or adjacent nodules.

There are no acute or suspicious osseous abnormalities. Degenerative
endplate spurring in the thoracic spine.

Review of the MIP images confirms the above findings.
IMPRESSION: 1. No pulmonary embolus.
2. Left lower lobe consolidation consistent with pneumonia.
3. Incidental findings of left thyroid nodule and 1 cm hypodense
right lobe liver lesion. Recommend further characterization on a
nonemergent basis with thyroid and right upper quadrant ultrasounds.

## 2022-02-22 ENCOUNTER — Encounter: Payer: Self-pay | Admitting: Gastroenterology

## 2023-09-21 ENCOUNTER — Other Ambulatory Visit: Payer: Self-pay | Admitting: Physician Assistant

## 2023-09-21 DIAGNOSIS — Z1231 Encounter for screening mammogram for malignant neoplasm of breast: Secondary | ICD-10-CM

## 2023-10-15 ENCOUNTER — Ambulatory Visit
Admission: RE | Admit: 2023-10-15 | Discharge: 2023-10-15 | Disposition: A | Discharge status: Home / Self Care | Payer: BC Managed Care – PPO | Source: Ambulatory Visit | Attending: Physician Assistant | Admitting: Physician Assistant

## 2023-10-15 DIAGNOSIS — Z1231 Encounter for screening mammogram for malignant neoplasm of breast: Secondary | ICD-10-CM
